# Patient Record
Sex: Female | Born: 1995 | Race: White | Hispanic: No | Marital: Single | Smoking: Never smoker
Health system: Southern US, Community
[De-identification: ages and names within clinical notes are randomized; demographics above are authoritative.]

## PROBLEM LIST (undated history)

## (undated) DIAGNOSIS — Z87442 Personal history of urinary calculi: Secondary | ICD-10-CM

## (undated) DIAGNOSIS — Q6589 Other specified congenital deformities of hip: Secondary | ICD-10-CM

---
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
## 2015-06-23 ENCOUNTER — Inpatient Hospital Stay: Attending: Rehabilitative and Restorative Service Providers" | Primary: Orthopaedic Surgery

## 2016-12-24 ENCOUNTER — Emergency Department
Admission: EM | Admit: 2016-12-24 | Discharge: 2016-12-24 | Disposition: A | Discharge status: Home / Self Care | Payer: BLUE CROSS/BLUE SHIELD | Attending: Emergency Medicine | Admitting: Emergency Medicine

## 2016-12-24 ENCOUNTER — Encounter: Payer: Self-pay | Admitting: Emergency Medicine

## 2016-12-24 ENCOUNTER — Emergency Department: Payer: BLUE CROSS/BLUE SHIELD

## 2016-12-24 DIAGNOSIS — R1031 Right lower quadrant pain: Secondary | ICD-10-CM | POA: Diagnosis present

## 2016-12-24 DIAGNOSIS — N23 Unspecified renal colic: Secondary | ICD-10-CM | POA: Diagnosis not present

## 2016-12-24 HISTORY — DX: Other specified congenital deformities of hip: Q65.89

## 2016-12-24 LAB — COMPREHENSIVE METABOLIC PANEL
ALK PHOS: 83 U/L (ref 38–126)
ALT: 17 U/L (ref 14–54)
ANION GAP: 9 (ref 5–15)
AST: 25 U/L (ref 15–41)
Albumin: 4.7 g/dL (ref 3.5–5.0)
BILIRUBIN TOTAL: 0.5 mg/dL (ref 0.3–1.2)
BUN: 20 mg/dL (ref 6–20)
CALCIUM: 9.4 mg/dL (ref 8.9–10.3)
CO2: 26 mmol/L (ref 22–32)
Chloride: 102 mmol/L (ref 101–111)
Creatinine, Ser: 0.75 mg/dL (ref 0.44–1.00)
Glucose, Bld: 76 mg/dL (ref 65–99)
Potassium: 3.5 mmol/L (ref 3.5–5.1)
Sodium: 137 mmol/L (ref 135–145)
TOTAL PROTEIN: 7.8 g/dL (ref 6.5–8.1)

## 2016-12-24 LAB — CBC
HCT: 41 % (ref 35.0–47.0)
HEMOGLOBIN: 14 g/dL (ref 12.0–16.0)
MCH: 29.9 pg (ref 26.0–34.0)
MCHC: 34.2 g/dL (ref 32.0–36.0)
MCV: 87.3 fL (ref 80.0–100.0)
Platelets: 309 10*3/uL (ref 150–440)
RBC: 4.7 MIL/uL (ref 3.80–5.20)
RDW: 12.7 % (ref 11.5–14.5)
WBC: 5.7 10*3/uL (ref 3.6–11.0)

## 2016-12-24 LAB — URINALYSIS, COMPLETE (UACMP) WITH MICROSCOPIC
BILIRUBIN URINE: NEGATIVE
Glucose, UA: NEGATIVE mg/dL
KETONES UR: NEGATIVE mg/dL
NITRITE: NEGATIVE
PH: 6 (ref 5.0–8.0)
Protein, ur: NEGATIVE mg/dL
Specific Gravity, Urine: 1.006 (ref 1.005–1.030)

## 2016-12-24 LAB — POCT PREGNANCY, URINE: Preg Test, Ur: NEGATIVE

## 2016-12-24 LAB — LIPASE, BLOOD: Lipase: 31 U/L (ref 11–51)

## 2016-12-24 MED ORDER — TAMSULOSIN HCL 0.4 MG PO CAPS: 0.4000 mg | ORAL_CAPSULE | Freq: Every day | ORAL | 0 refills | Status: AC

## 2016-12-24 MED ORDER — OXYCODONE-ACETAMINOPHEN 5-325 MG PO TABS: 2.0000 | ORAL_TABLET | Freq: Four times a day (QID) | ORAL | 0 refills | Status: DC | PRN

## 2016-12-24 MED ORDER — TAMSULOSIN HCL 0.4 MG PO CAPS
0.4000 mg | ORAL_CAPSULE | Freq: Once | ORAL | Status: AC
  Administered 2016-12-24: 0.4 mg via ORAL
  Filled 2016-12-24: qty 1

## 2016-12-24 MED ORDER — KETOROLAC TROMETHAMINE 10 MG PO TABS
10.0000 mg | ORAL_TABLET | Freq: Once | ORAL | Status: AC
  Administered 2016-12-24: 10 mg via ORAL
  Filled 2016-12-24: qty 1

## 2016-12-24 MED ORDER — ONDANSETRON 4 MG PO TBDP: 4.0000 mg | ORAL_TABLET | Freq: Three times a day (TID) | ORAL | 0 refills | Status: DC | PRN

## 2016-12-24 NOTE — ED Triage Notes (Signed)
Lower right abd pain began this am.

## 2016-12-24 NOTE — ED Notes (Signed)
Pt updated on ct renal status. Pt verbalizes understanding.

## 2016-12-24 NOTE — ED Provider Notes (Signed)
[facility] Emergency Department Provider Note        Time seen: ----------------------------------------- 9:54 PM on 12/24/2016 -----------------------------------------    I have reviewed the triage vital signs and the nursing notes.   HISTORY  Chief Complaint Abdominal Pain    HPI Alejandra Lam is a 21 y.o. female who presents to the ER for right lower quadrant abdominal pain that began this morning. She has not had a history of this before, nothing makes it better or worse. She describes a sharp pain. She's not had any dysuria. She was not concerned that she was pregnant. She denies fevers, chills, chest pain, shortness of breath, vomiting and diarrhea.   Past Medical History:  Diagnosis Date  . Hip dysplasia     There are no active problems to display for this patient.   Past Surgical History:  Procedure Laterality Date  . HIP SURGERY Left     Allergies Patient has no known allergies.  Social History Social History  Substance Use Topics  . Smoking status: Never Smoker  . Smokeless tobacco: Not on file  . Alcohol use Not on file    Review of Systems Constitutional: Negative for fever. Cardiovascular: Negative for chest pain. Respiratory: Negative for shortness of breath. Gastrointestinal: Positive for abdominal pain Genitourinary: Negative for dysuria. Musculoskeletal: Negative for back pain. Skin: Negative for rash. Neurological: Negative for headaches, focal weakness or numbness.  10-point ROS otherwise negative.  ____________________________________________   PHYSICAL EXAM:  VITAL SIGNS: ED Triage Vitals  Enc Vitals Group     BP 12/24/16 1826 113/69     Pulse Rate 12/24/16 1826 87     Resp 12/24/16 1826 20     Temp 12/24/16 1826 98.5 F (36.9 C)     Temp Source 12/24/16 1826 Oral     SpO2 12/24/16 1826 99 %     Weight 12/24/16 1827 150 lb (68 kg)     Height 12/24/16 1827 5\' 7"  (1.702 m)     Head Circumference  --      Peak Flow --      Pain Score 12/24/16 1827 6     Pain Loc --      Pain Edu? --      Excl. in GC? --     Constitutional: Alert and oriented. Well appearing and in no distress. Eyes: Conjunctivae are normal. PERRL. Normal extraocular movements. ENT   Head: Normocephalic and atraumatic.   Nose: No congestion/rhinnorhea.   Mouth/Throat: Mucous membranes are moist.   Neck: No stridor. Cardiovascular: Normal rate, regular rhythm. No murmurs, rubs, or gallops. Respiratory: Normal respiratory effort without tachypnea nor retractions. Breath sounds are clear and equal bilaterally. No wheezes/rales/rhonchi. Gastrointestinal: Mild right lower quadrant tenderness, rebound or guarding. Normal bowel sounds. Musculoskeletal: Nontender with normal range of motion in all extremities. No lower extremity tenderness nor edema. Neurologic:  Normal speech and language. No gross focal neurologic deficits are appreciated.  Skin:  Skin is warm, dry and intact. No rash noted. Psychiatric: Mood and affect are normal. Speech and behavior are normal.  ____________________________________________  ED COURSE:  Pertinent labs & imaging results that were available during my care of the patient were reviewed by me and considered in my medical decision making (see chart for details). Patient presents to the ER for right lower quadrant abdominal pain. We will assess with labs and imaging.   Procedures ____________________________________________   LABS (pertinent positives/negatives)  Labs Reviewed  URINALYSIS, COMPLETE (UACMP) WITH MICROSCOPIC - Abnormal;  Notable for the following:       Result Value   Color, Urine YELLOW (*)    APPearance CLEAR (*)    Hgb urine dipstick LARGE (*)    Leukocytes, UA SMALL (*)    Bacteria, UA RARE (*)    Squamous Epithelial / LPF 0-5 (*)    All other components within normal limits  LIPASE, BLOOD  COMPREHENSIVE METABOLIC PANEL  CBC  POC URINE PREG, ED   POCT PREGNANCY, URINE    RADIOLOGY Images were viewed by me  CT renal protocol IMPRESSION: 1. 5 mm distal right ureteral calculus with mild hydroureteronephrosis. 2. Multiple bilateral renal calculi measuring up to 1 cm on the right.  ____________________________________________  FINAL ASSESSMENT AND PLAN  Renal colic  Plan: Patient with labs and imaging as dictated above. Patient is largely asymptomatic despite large kidney stone. She'll be discharged with Flomax, pain medicine and antiemetics. She'll be referred to urology for outpatient follow-up.   Emily Filbert, MD   Note: This note was generated in part or whole with voice recognition software. Voice recognition is usually quite accurate but there are transcription errors that can and very often do occur. I apologize for any typographical errors that were not detected and corrected.     Emily Filbert, MD 12/24/16 2156

## 2016-12-29 ENCOUNTER — Encounter: Payer: Self-pay | Admitting: Emergency Medicine

## 2016-12-29 ENCOUNTER — Emergency Department: Payer: BLUE CROSS/BLUE SHIELD

## 2016-12-29 ENCOUNTER — Emergency Department
Admission: EM | Admit: 2016-12-29 | Discharge: 2016-12-29 | Disposition: A | Discharge status: Home / Self Care | Payer: BLUE CROSS/BLUE SHIELD | Attending: Emergency Medicine | Admitting: Emergency Medicine

## 2016-12-29 DIAGNOSIS — N390 Urinary tract infection, site not specified: Secondary | ICD-10-CM

## 2016-12-29 DIAGNOSIS — N200 Calculus of kidney: Secondary | ICD-10-CM

## 2016-12-29 DIAGNOSIS — R109 Unspecified abdominal pain: Secondary | ICD-10-CM | POA: Diagnosis present

## 2016-12-29 LAB — CBC
HCT: 40.8 % (ref 35.0–47.0)
HEMOGLOBIN: 14 g/dL (ref 12.0–16.0)
MCH: 30 pg (ref 26.0–34.0)
MCHC: 34.3 g/dL (ref 32.0–36.0)
MCV: 87.4 fL (ref 80.0–100.0)
PLATELETS: 270 10*3/uL (ref 150–440)
RBC: 4.66 MIL/uL (ref 3.80–5.20)
RDW: 12.8 % (ref 11.5–14.5)
WBC: 4.5 10*3/uL (ref 3.6–11.0)

## 2016-12-29 LAB — BASIC METABOLIC PANEL
Anion gap: 8 (ref 5–15)
BUN: 17 mg/dL (ref 6–20)
CALCIUM: 9 mg/dL (ref 8.9–10.3)
CO2: 26 mmol/L (ref 22–32)
CREATININE: 0.65 mg/dL (ref 0.44–1.00)
Chloride: 102 mmol/L (ref 101–111)
GFR calc Af Amer: >60 mL/min (ref >60)
GLUCOSE: 97 mg/dL (ref 65–99)
Potassium: 3.9 mmol/L (ref 3.5–5.1)
Sodium: 136 mmol/L (ref 135–145)

## 2016-12-29 LAB — URINALYSIS, COMPLETE (UACMP) WITH MICROSCOPIC
BILIRUBIN URINE: NEGATIVE
GLUCOSE, UA: NEGATIVE mg/dL
Hgb urine dipstick: NEGATIVE
KETONES UR: NEGATIVE mg/dL
Nitrite: NEGATIVE
PROTEIN: NEGATIVE mg/dL
Specific Gravity, Urine: 1.015 (ref 1.005–1.030)
pH: 7 (ref 5.0–8.0)

## 2016-12-29 LAB — POCT PREGNANCY, URINE: PREG TEST UR: NEGATIVE

## 2016-12-29 MED ORDER — SODIUM CHLORIDE 0.9 % IV BOLUS (SEPSIS)
1000.0000 mL | Freq: Once | INTRAVENOUS | Status: AC
  Administered 2016-12-29: 1000 mL via INTRAVENOUS

## 2016-12-29 MED ORDER — ONDANSETRON HCL 4 MG/2ML IJ SOLN
4.0000 mg | Freq: Once | INTRAMUSCULAR | Status: AC
  Administered 2016-12-29: 4 mg via INTRAVENOUS
  Filled 2016-12-29: qty 2

## 2016-12-29 MED ORDER — SULFAMETHOXAZOLE-TRIMETHOPRIM 800-160 MG PO TABS: 1.0000 | ORAL_TABLET | Freq: Two times a day (BID) | ORAL | 0 refills | Status: DC

## 2016-12-29 MED ORDER — SULFAMETHOXAZOLE-TRIMETHOPRIM 800-160 MG PO TABS
1.0000 | ORAL_TABLET | Freq: Once | ORAL | Status: AC
  Administered 2016-12-29: 1 via ORAL
  Filled 2016-12-29: qty 1

## 2016-12-29 NOTE — ED Notes (Signed)
AOx3.  Skin warm and dry.  NAD.  Ambulates with easy and steady gait. 

## 2016-12-29 NOTE — ED Provider Notes (Signed)
[facility] Emergency Department Provider Note  ____________________________________________   First MD Initiated Contact with Patient 12/29/16 1424     (approximate)  I have reviewed the triage vital signs and the nursing notes.   HISTORY  Chief Complaint Flank Pain   HPI Alejandra Lam is a 21 y.o. female who was recently diagnosed with a right-sided kidney stone, 5 mm, on January 20 here in the emergency department. She says that she has not needed any Percocet at home for pain control does not use any Zofran. However, said that this morning she began feeling sharp pain at a maximum of 7 out of 10. She says the pain is a 1 out of 10 right now and she continues to deny any nausea and vomiting. She says the pain is to her right back and radiates around to the right hemipelvis. Denies any fevers.    Past Medical History:  Diagnosis Date  . Hip dysplasia     There are no active problems to display for this patient.   Past Surgical History:  Procedure Laterality Date  . HIP SURGERY Left     Prior to Admission medications   Medication Sig Start Date End Date Taking? Authorizing Provider  ondansetron (ZOFRAN ODT) 4 MG disintegrating tablet Take 1 tablet (4 mg total) by mouth every 8 (eight) hours as needed for nausea or vomiting. 12/24/16   Emily FilbertJonathan E Williams, MD  oxyCODONE-acetaminophen (PERCOCET) 5-325 MG tablet Take 2 tablets by mouth every 6 (six) hours as needed for moderate pain or severe pain. 12/24/16   Emily FilbertJonathan E Williams, MD  tamsulosin (FLOMAX) 0.4 MG CAPS capsule Take 1 capsule (0.4 mg total) by mouth daily after breakfast. 12/24/16   Emily FilbertJonathan E Williams, MD    Allergies Patient has no known allergies.  History reviewed. No pertinent family history.  Social History Social History  Substance Use Topics  . Smoking status: Never Smoker  . Smokeless tobacco: Never Used  . Alcohol use Yes     Comment: 4 times a month    Review of  Systems Constitutional: No fever/chills Eyes: No visual changes. ENT: No sore throat. Cardiovascular: Denies chest pain. Respiratory: Denies shortness of breath. Gastrointestinal:  No nausea, no vomiting.  No diarrhea.  No constipation. Genitourinary: Negative for dysuria. Musculoskeletal: as above Skin: Negative for rash. Neurological: Negative for headaches, focal weakness or numbness.  10-point ROS otherwise negative.  ____________________________________________   PHYSICAL EXAM:  VITAL SIGNS: ED Triage Vitals  Enc Vitals Group     BP 12/29/16 1300 131/81     Pulse Rate 12/29/16 1300 (!) 121     Resp 12/29/16 1300 16     Temp 12/29/16 1300 98.5 F (36.9 C)     Temp Source 12/29/16 1300 Oral     SpO2 12/29/16 1300 100 %     Weight 12/29/16 1302 150 lb (68 kg)     Height 12/29/16 1302 5\' 7"  (1.702 m)     Head Circumference --      Peak Flow --      Pain Score 12/29/16 1302 3     Pain Loc --      Pain Edu? --      Excl. in GC? --     Constitutional: Alert and oriented. Well appearing and in no acute distress. Eyes: Conjunctivae are normal. PERRL. EOMI. Head: Atraumatic. Nose: No congestion/rhinnorhea. Mouth/Throat: Mucous membranes are moist.   Neck: No stridor.   Cardiovascular: Normal rate, regular rhythm.  Grossly normal heart sounds.  Good peripheral circulation.Heart rate in the room was 86 bpm. Respiratory: Normal respiratory effort.  No retractions. Lungs CTAB. Gastrointestinal: Soft and nontender. No distention. Mild right CVA tenderness to palpation. Musculoskeletal: No lower extremity tenderness nor edema.  No joint effusions. Neurologic:  Normal speech and language. No gross focal neurologic deficits are appreciated.  Skin:  Skin is warm, dry and intact. No rash noted. Psychiatric: Mood and affect are normal. Speech and behavior are normal.  ____________________________________________   LABS (all labs ordered are listed, but only abnormal results are  displayed)  Labs Reviewed  URINALYSIS, COMPLETE (UACMP) WITH MICROSCOPIC - Abnormal; Notable for the following:       Result Value   Color, Urine YELLOW (*)    APPearance HAZY (*)    Leukocytes, UA MODERATE (*)    Bacteria, UA RARE (*)    Squamous Epithelial / LPF 0-5 (*)    All other components within normal limits  URINE CULTURE  BASIC METABOLIC PANEL  CBC  POC URINE PREG, ED  POCT PREGNANCY, URINE   ____________________________________________  EKG   ____________________________________________  RADIOLOGY  US Renal (Final result)  Result time 12/29/16 17:18:29  Final result by Corky Sox, MD (12/29/16 17:18:29)           Narrative:   CLINICAL DATA: Right-sided 5 mm stone.  EXAM: RENAL / URINARY TRACT ULTRASOUND COMPLETE  COMPARISON: None.  FINDINGS: Right Kidney:  Length: 11.2 cm. Multiple renal stones, corresponding to the findings on recent CT. Largest measured stone on today's ultrasound is 10 mm but this is likely an over-measurement based on the measurements on recent CT. Mild right-sided hydronephrosis, similar to recent CT.  Left Kidney:  Length: 10.2 cm. Multiple stones. No hydronephrosis.  Bladder:  Appears normal for degree of bladder distention.  IMPRESSION: 1. Bilateral nephrolithiasis. 2. Mild right-sided hydronephrosis.   Electronically Signed By: Bary Richard M.D. On: 12/29/2016 17:18          ____________________________________________   PROCEDURES  Procedure(s) performed:   Procedures  Critical Care performed:   ____________________________________________   INITIAL IMPRESSION / ASSESSMENT AND PLAN / ED COURSE  Pertinent labs & imaging results that were available during my care of the patient were reviewed by me and considered in my medical decision making (see chart for details).  ----------------------------------------- 4:18 PM on 12/29/2016 -----------------------------------------  I  discussed case with Dr. Claiborne Billings radiology recommends to start patient on Septra and sent a urine culture. He says that he will see the patient in a.m. tomorrow as long as there is no sign of obstruction on the ultrasound of the kidneys.    ----------------------------------------- 5:58 PM on 12/29/2016 -----------------------------------------  Patient resting comfortable at this time. Reading a book. Says her pain is a 2 out of 10 and is "normal." She had mild hydronephrosis on her ultrasound. Again without any fever. Normal white blood cell count. I discussed the plan for follow-up tomorrow morning with Dr. Artis Flock and she is understanding on to comply. We discussed return to the emergency department for any worsening or concerning symptoms such as nausea or vomiting, pain and especially fever. It is likely that the stone is moving and that this is what is causing her discomfort at this time. She will also be discharged on Septra. She is understanding of this plan and willing to comply. The patient is nontoxic appearing at this time.   ____________________________________________   FINAL CLINICAL IMPRESSION(S) / ED DIAGNOSES  UTI. Kidney stone.  NEW MEDICATIONS STARTED DURING THIS VISIT:  New Prescriptions   No medications on file     Note:  This document was prepared using Dragon voice recognition software and may include unintentional dictation errors.    Myrna Blazer, MD 12/29/16 [phone]

## 2016-12-29 NOTE — ED Triage Notes (Signed)
Pt to ED c/o right flank pain since this morning.  States recently seen and treated for right sided kidney stone this past Saturday.  Pt denies n/v/d.  Denies urinary symptoms.

## 2016-12-30 NOTE — H&P (Signed)
NAME:  Alejandra Lam, Alejandra Lam                     ACCOUNT NO.:  MEDICAL RECORD NO.:  [id number]  LOCATION:                                 FACILITY:  PHYSICIAN:  Alejandra Lam          DATE OF BIRTH:  02/29/1996  DATE OF ADMISSION: DATE OF DISCHARGE:                            HISTORY AND PHYSICAL   SAME-DAY SURGERY:  February 6th.  CHIEF COMPLAINT:  Right flank pain.  HISTORY OF PRESENT ILLNESS:  [name] is a 21 year old Caucasian female with the sudden onset of right lower quadrant abdominal pain associated with nausea and vomiting on January 27th.  She had a CT scan performed at that time, which revealed a distal 5 mm right ureteral stone with mild hydronephrosis.  The pain became severe again February 2nd prompting another ER visit.  She had an ultrasound at that time confirming mild hydronephrosis.  She was also noted to have bilateral renal calculi.  She comes in now for right ureteroscopic ureterolithotomy with holmium laser lithotripsy.  PAST MEDICAL HISTORY:  No drug allergies.  CURRENT MEDICATIONS:  Tamsulosin, Bactrim DS, Nucynta, Zuplenz, oxycodone.  SURGICAL HISTORY:  Repair of the left hip in 2015.  SOCIAL HISTORY:  The patient denied tobacco use.  She consumes 2 alcoholic beverages per week.  FAMILY HISTORY:  Father is living age 21 with history of kidney stones. Mother is living age 21 with history of breast cancer and colon cancer.  PAST AND CURRENT MEDICAL CONDITION:  Negative.  REVIEW OF SYSTEMS:  The patient denies chest pain, shortness of breath, diabetes, stroke, or hypertension.  PHYSICAL EXAMINATION:  GENERAL:  A well-nourished white female, in no distress. HEENT:  Sclerae clear.  Pupils are equally round and reactive to light accommodation.  Extraocular motions are intact. NECK:  Supple.  No palpable cervical adenopathy. LUNGS:  Clear to auscultation. CARDIOVASCULAR:  Regular rhythm and rate without audible murmurs. ABDOMEN:  Soft, nontender  abdomen. GU AND RECTAL:  Deferred. NEUROMUSCULAR:  Alert and oriented x3.  IMPRESSION: 1. Right ureterolithiasis with renal colic and hydronephrosis. 2. Bilateral nephrolithiasis.  PLAN:  Right ureteroscopic ureterolithotomy with holmium laser lithotripsy.          ______________________________ Alejandra Lam     MW/MEDQ  D:  12/30/2016  T:  12/30/2016  Job:  [id number]

## 2016-12-31 LAB — URINE CULTURE

## 2017-01-02 ENCOUNTER — Encounter
Admission: RE | Admit: 2017-01-02 | Discharge: 2017-01-02 | Disposition: A | Discharge status: Home / Self Care | Payer: BLUE CROSS/BLUE SHIELD | Source: Ambulatory Visit | Attending: Urology | Admitting: Urology

## 2017-01-02 HISTORY — DX: Personal history of urinary calculi: Z87.442

## 2017-01-02 MED ORDER — DEXTROSE 5 % IV SOLN
1.0000 g | Freq: Once | INTRAVENOUS | Status: AC
  Administered 2017-01-03: 1 g via INTRAVENOUS
  Filled 2017-01-02: qty 10

## 2017-01-02 NOTE — Patient Instructions (Signed)
  Your procedure is scheduled on: 01-03-17 Report to Same Day Surgery 2nd floor medical mall [facility](Medical Mall Entrance-take elevator on left to 2nd floor.  Check in with surgery information desk.) To find out your arrival time please call [phone] between 1PM - 3PM on 01-02-17  Remember: Instructions that are not followed completely may result in serious medical risk, up to and including death, or upon the discretion of your surgeon and anesthesiologist your surgery may need to be rescheduled.    _x___ 1. Do not eat food or drink liquids after midnight. No gum chewing or hard candies.     __x__ 2. No Alcohol for 24 hours before or after surgery.   __x__3. No Smoking for 24 prior to surgery.   ____  4. Bring all medications with you on the day of surgery if instructed.    __x__ 5. Notify your doctor if there is any change in your medical condition     (cold, fever, infections).     Do not wear jewelry, make-up, hairpins, clips or nail polish.  Do not wear lotions, powders, or perfumes. You may wear deodorant.  Do not shave 48 hours prior to surgery. Men may shave face and neck.  Do not bring valuables to the hospital.    [facility] is not responsible for any belongings or valuables.               Contacts, dentures or bridgework may not be worn into surgery.  Leave your suitcase in the car. After surgery it may be brought to your room.  For patients admitted to the hospital, discharge time is determined by your treatment team.   Patients discharged the day of surgery will not be allowed to drive home.  You will need someone to drive you home and stay with you the night of your procedure.    Please read over the following fact sheets that you were given:   [facility] Preparing for Surgery and or MRSA Information   _x___ Take these medicines the morning of surgery with A SIP OF WATER:    1. MAY TAKE PERCOCET IF NEEDED AM OF SURGERY  2.  3.  4.  5.  6.  ____Fleets enema or  Magnesium Citrate as directed.   ____ Use CHG Soap or sage wipes as directed on instruction sheet   ____ Use inhalers on the day of surgery and bring to hospital day of surgery  ____ Stop metformin 2 days prior to surgery    ____ Take 1/2 of usual insulin dose the night before surgery and none on the morning of  surgery.   ____ Stop Aspirin, Coumadin, Pllavix ,Eliquis, Effient, or Pradaxa  x__ Stop Anti-inflammatories such as Advil, Aleve, Ibuprofen, Motrin, Naproxen,          Naprosyn, Goodies powders or aspirin products NOW-Ok to take Tylenol.   ____ Stop supplements until after surgery.    ____ Bring C-Pap to the hospital.

## 2017-01-03 ENCOUNTER — Encounter: Payer: Self-pay | Admitting: *Deleted

## 2017-01-03 ENCOUNTER — Ambulatory Visit
Admission: RE | Admit: 2017-01-03 | Discharge: 2017-01-03 | Disposition: A | Discharge status: Home / Self Care | Payer: BLUE CROSS/BLUE SHIELD | Source: Ambulatory Visit | Attending: Urology | Admitting: Urology

## 2017-01-03 ENCOUNTER — Ambulatory Visit: Payer: BLUE CROSS/BLUE SHIELD | Admitting: Anesthesiology

## 2017-01-03 ENCOUNTER — Encounter
Admission: RE | Disposition: A | Discharge status: Home / Self Care | Payer: Self-pay | Source: Ambulatory Visit | Attending: Urology

## 2017-01-03 DIAGNOSIS — N132 Hydronephrosis with renal and ureteral calculous obstruction: Secondary | ICD-10-CM | POA: Diagnosis not present

## 2017-01-03 DIAGNOSIS — N201 Calculus of ureter: Secondary | ICD-10-CM

## 2017-01-03 HISTORY — PX: URETEROSCOPY WITH HOLMIUM LASER LITHOTRIPSY: SHX6645

## 2017-01-03 LAB — POCT PREGNANCY, URINE: PREG TEST UR: NEGATIVE

## 2017-01-03 SURGERY — URETEROSCOPY, WITH LITHOTRIPSY USING HOLMIUM LASER
Anesthesia: General | Site: Ureter | Laterality: Right | Wound class: Clean Contaminated

## 2017-01-03 MED ORDER — DEXAMETHASONE SODIUM PHOSPHATE 10 MG/ML IJ SOLN
INTRAMUSCULAR | Status: AC
  Filled 2017-01-03: qty 1

## 2017-01-03 MED ORDER — FUROSEMIDE 10 MG/ML IJ SOLN
INTRAMUSCULAR | Status: AC
  Filled 2017-01-03: qty 4

## 2017-01-03 MED ORDER — LACTATED RINGERS IV SOLN: INTRAVENOUS | Status: DC | PRN

## 2017-01-03 MED ORDER — SODIUM CHLORIDE 0.9 % IV SOLN
INTRAVENOUS | Status: DC
  Administered 2017-01-03 (×2): via INTRAVENOUS

## 2017-01-03 MED ORDER — DEXAMETHASONE SODIUM PHOSPHATE 10 MG/ML IJ SOLN
INTRAMUSCULAR | Status: DC | PRN
  Administered 2017-01-03: 10 mg via INTRAVENOUS

## 2017-01-03 MED ORDER — ONDANSETRON HCL 4 MG/2ML IJ SOLN
INTRAMUSCULAR | Status: AC
  Filled 2017-01-03: qty 2

## 2017-01-03 MED ORDER — FAMOTIDINE 20 MG PO TABS
ORAL_TABLET | ORAL | Status: AC
  Filled 2017-01-03: qty 1

## 2017-01-03 MED ORDER — KETOROLAC TROMETHAMINE 30 MG/ML IJ SOLN
INTRAMUSCULAR | Status: DC | PRN
  Administered 2017-01-03: 30 mg via INTRAVENOUS

## 2017-01-03 MED ORDER — BELLADONNA ALKALOIDS-OPIUM 16.2-60 MG RE SUPP
RECTAL | Status: DC | PRN
  Administered 2017-01-03: 1 via RECTAL

## 2017-01-03 MED ORDER — FENTANYL CITRATE (PF) 100 MCG/2ML IJ SOLN
INTRAMUSCULAR | Status: DC | PRN
  Administered 2017-01-03: 50 ug via INTRAVENOUS

## 2017-01-03 MED ORDER — FENTANYL CITRATE (PF) 100 MCG/2ML IJ SOLN: 25.0000 ug | INTRAMUSCULAR | Status: DC | PRN

## 2017-01-03 MED ORDER — IOTHALAMATE MEGLUMINE 43 % IV SOLN
INTRAVENOUS | Status: DC | PRN
  Administered 2017-01-03: 15 mL

## 2017-01-03 MED ORDER — KETOROLAC TROMETHAMINE 30 MG/ML IJ SOLN
INTRAMUSCULAR | Status: AC
  Filled 2017-01-03: qty 1

## 2017-01-03 MED ORDER — FENTANYL CITRATE (PF) 100 MCG/2ML IJ SOLN
INTRAMUSCULAR | Status: AC
  Filled 2017-01-03: qty 2

## 2017-01-03 MED ORDER — LIDOCAINE HCL 2 % EX GEL
CUTANEOUS | Status: DC | PRN
  Administered 2017-01-03: 1

## 2017-01-03 MED ORDER — LIDOCAINE HCL (PF) 2 % IJ SOLN
INTRAMUSCULAR | Status: AC
  Filled 2017-01-03: qty 2

## 2017-01-03 MED ORDER — MIDAZOLAM HCL 2 MG/2ML IJ SOLN
INTRAMUSCULAR | Status: AC
  Filled 2017-01-03: qty 2

## 2017-01-03 MED ORDER — BELLADONNA ALKALOIDS-OPIUM 16.2-60 MG RE SUPP
RECTAL | Status: AC
  Filled 2017-01-03: qty 1

## 2017-01-03 MED ORDER — FUROSEMIDE 10 MG/ML IJ SOLN
INTRAMUSCULAR | Status: DC | PRN
  Administered 2017-01-03: 10 mg via INTRAMUSCULAR

## 2017-01-03 MED ORDER — ONDANSETRON HCL 4 MG/2ML IJ SOLN: 4.0000 mg | Freq: Once | INTRAMUSCULAR | Status: DC | PRN

## 2017-01-03 MED ORDER — FAMOTIDINE 20 MG PO TABS
20.0000 mg | ORAL_TABLET | Freq: Once | ORAL | Status: AC
  Administered 2017-01-03: 20 mg via ORAL

## 2017-01-03 MED ORDER — LIDOCAINE HCL 2 % EX GEL
CUTANEOUS | Status: AC
  Filled 2017-01-03: qty 10

## 2017-01-03 MED ORDER — MIDAZOLAM HCL 2 MG/2ML IJ SOLN
INTRAMUSCULAR | Status: DC | PRN
  Administered 2017-01-03: 2 mg via INTRAVENOUS

## 2017-01-03 MED ORDER — PROPOFOL 10 MG/ML IV BOLUS
INTRAVENOUS | Status: DC | PRN
  Administered 2017-01-03: 150 mg via INTRAVENOUS

## 2017-01-03 MED ORDER — PROPOFOL 10 MG/ML IV BOLUS
INTRAVENOUS | Status: AC
  Filled 2017-01-03: qty 20

## 2017-01-03 MED ORDER — LIDOCAINE HCL (CARDIAC) 20 MG/ML IV SOLN
INTRAVENOUS | Status: DC | PRN
Start: 2017-01-03 — End: 2017-01-03
  Administered 2017-01-03: 75 mg via INTRAVENOUS

## 2017-01-03 SURGICAL SUPPLY — 23 items
BAG DRAIN CYSTO-URO LG1000N (MISCELLANEOUS) ×2 IMPLANT
CATH URETL 5X70 OPEN END (CATHETERS) ×2 IMPLANT
CNTNR SPEC 2.5X3XGRAD LEK (MISCELLANEOUS) ×1
CONRAY 43 FOR UROLOGY 50M (MISCELLANEOUS) ×2 IMPLANT
CONT SPEC 4OZ STER OR WHT (MISCELLANEOUS) ×1
CONTAINER SPEC 2.5X3XGRAD LEK (MISCELLANEOUS) ×1 IMPLANT
FIBER LASER 365 (Laser) ×2 IMPLANT
GLOVE BIO SURGEON STRL SZ7 (GLOVE) ×4 IMPLANT
GLOVE BIO SURGEON STRL SZ7.5 (GLOVE) ×2 IMPLANT
GOWN STRL REUS W/ TWL LRG LVL4 (GOWN DISPOSABLE) ×1 IMPLANT
GOWN STRL REUS W/TWL LRG LVL4 (GOWN DISPOSABLE) ×1
GOWN STRL REUS W/TWL XL LVL4 (GOWN DISPOSABLE) ×2 IMPLANT
GUIDEWIRE STR ZIPWIRE 035X150 (MISCELLANEOUS) ×2 IMPLANT
KIT RM TURNOVER CYSTO AR (KITS) ×2 IMPLANT
PACK CYSTO AR (MISCELLANEOUS) ×2 IMPLANT
PREP PVP WINGED SPONGE (MISCELLANEOUS) IMPLANT
SET CYSTO W/LG BORE CLAMP LF (SET/KITS/TRAYS/PACK) ×2 IMPLANT
SOL .9 NS 3000ML IRR  AL (IV SOLUTION) ×1
SOL .9 NS 3000ML IRR UROMATIC (IV SOLUTION) ×1 IMPLANT
SOL PREP PVP 2OZ (MISCELLANEOUS) ×2
SOLUTION PREP PVP 2OZ (MISCELLANEOUS) ×1 IMPLANT
SURGILUBE 2OZ TUBE FLIPTOP (MISCELLANEOUS) ×2 IMPLANT
WATER STERILE IRR 1000ML POUR (IV SOLUTION) ×2 IMPLANT

## 2017-01-03 NOTE — Anesthesia Postprocedure Evaluation (Signed)
Anesthesia Post Note  Patient: Alejandra Lam  Procedure(s) Performed: Procedure(s) (LRB): URETEROSCOPY WITH HOLMIUM LASER LITHOTRIPSY (Right)  Patient location during evaluation: PACU Anesthesia Type: General Level of consciousness: awake and alert Pain management: pain level controlled Vital Signs Assessment: post-procedure vital signs reviewed and stable Respiratory status: spontaneous breathing and respiratory function stable Cardiovascular status: stable Anesthetic complications: no     Last Vitals:  Vitals:   01/03/17 1350 01/03/17 1359  BP: (!) 108/48   Pulse: 70 (!) 54  Resp: 13 12  Temp:      Last Pain:  Vitals:   01/03/17 1339  TempSrc: Tympanic  PainSc: 0-No pain                 Lailynn Southgate K

## 2017-01-03 NOTE — Anesthesia Preprocedure Evaluation (Signed)
Anesthesia Evaluation  Patient identified by MRN, date of birth, ID band Patient awake    Reviewed: Allergy & Precautions, NPO status , Patient's Chart, lab work & pertinent test results  History of Anesthesia Complications Negative for: history of anesthetic complications  Airway Mallampati: I       Dental   Pulmonary neg pulmonary ROS,           Cardiovascular negative cardio ROS       Neuro/Psych negative neurological ROS     GI/Hepatic negative GI ROS, Neg liver ROS,   Endo/Other  negative endocrine ROS  Renal/GU negative Renal ROS     Musculoskeletal   Abdominal   Peds  Hematology negative hematology ROS (+)   Anesthesia Other Findings   Reproductive/Obstetrics                             Anesthesia Physical Anesthesia Plan  ASA: I  Anesthesia Plan: General   Post-op Pain Management:    Induction: Intravenous  Airway Management Planned: LMA  Additional Equipment:   Intra-op Plan:   Post-operative Plan:   Informed Consent: I have reviewed the patients History and Physical, chart, labs and discussed the procedure including the risks, benefits and alternatives for the proposed anesthesia with the patient or authorized representative who has indicated his/her understanding and acceptance.     Plan Discussed with:   Anesthesia Plan Comments:         Anesthesia Quick Evaluation

## 2017-01-03 NOTE — Transfer of Care (Signed)
Immediate Anesthesia Transfer of Care Note  Patient: Alejandra DegreeCelia R Tecson  Procedure(s) Performed: Procedure(s): URETEROSCOPY WITH HOLMIUM LASER LITHOTRIPSY (Right)  Patient Location: PACU  Anesthesia Type:General  Level of Consciousness: awake, alert , oriented and patient cooperative  Airway & Oxygen Therapy: Patient Spontanous Breathing  Post-op Assessment: Report given to RN and Post -op Vital signs reviewed and stable  Post vital signs: Reviewed and stable  Last Vitals:  Vitals:   01/03/17 1219 01/03/17 1339  BP: 114/62 115/73  Pulse: 90 79  Resp: 16 11  Temp: 37 C 36.3 C    Last Pain:  Vitals:   01/03/17 1339  TempSrc: Tympanic  PainSc: 0-No pain         Complications: No apparent anesthesia complications

## 2017-01-03 NOTE — H&P (Signed)
Date of Initial H&P: 12/30/16  History reviewed, patient examined, no change in status, stable for surgery.

## 2017-01-03 NOTE — Anesthesia Procedure Notes (Signed)
Procedure Name: LMA Insertion Date/Time: 01/03/2017 12:59 PM Performed by: Marlana SalvageJESSUP, Rayleen Wyrick Pre-anesthesia Checklist: Patient identified, Emergency Drugs available, Suction available, Patient being monitored and Timeout performed Patient Re-evaluated:Patient Re-evaluated prior to inductionOxygen Delivery Method: Circle system utilized Preoxygenation: Pre-oxygenation with 100% oxygen Intubation Type: IV induction Ventilation: Mask ventilation without difficulty LMA: LMA inserted LMA Size: 4.0 Number of attempts: 1 Placement Confirmation: positive ETCO2 and breath sounds checked- equal and bilateral Dental Injury: Teeth and Oropharynx as per pre-operative assessment

## 2017-01-03 NOTE — Anesthesia Post-op Follow-up Note (Cosign Needed)
Anesthesia QCDR form completed.        

## 2017-01-03 NOTE — Discharge Instructions (Addendum)
AMBULATORY SURGERY  DISCHARGE INSTRUCTIONS   1) The drugs that you were given will stay in your system until tomorrow so for the next 24 hours you should not:  A) Drive an automobile B) Make any legal decisions C) Drink any alcoholic beverage   2) You may resume regular meals tomorrow.  Today it is better to start with liquids and gradually work up to solid foods.  You may eat anything you prefer, but it is better to start with liquids, then soup and crackers, and gradually work up to solid foods.   3) Please notify your doctor immediately if you have any unusual bleeding, trouble breathing, redness and pain at the surgery site, drainage, fever, or pain not relieved by medication.    4) Additional Instructions:        Please contact your physician with any problems or Same Day Surgery at [phone], Monday through Friday 6 am to 4 pm, or [place] at [facility] number at [phone].     Renal Colic Renal colic is pain that is caused by passing a kidney stone. The pain can be sharp and severe. It may be felt in the back, abdomen, side (flank), or groin. It can cause nausea. Renal colic can come and go. Follow these instructions at home: Watch your condition for any changes. The following actions may help to lessen any discomfort that you are feeling:  Take medicines only as directed by your health care provider.  Ask your health care provider if it is okay to take over-the-counter pain medicine.  Drink enough fluid to keep your urine clear or pale yellow. Drink 6-8 glasses of water each day.  Limit the amount of salt that you eat to less than 2 grams per day.  Reduce the amount of protein in your diet. Eat less meat, fish, nuts, and dairy.  Avoid foods such as spinach, rhubarb, nuts, or bran. These may make kidney stones more likely to form. Contact a health care provider if:  You have a fever or chills.  Your urine smells bad or looks cloudy.  You have  pain or burning when you pass urine. Get help right away if:  Your flank pain or groin pain suddenly worsens.  You become confused or disoriented or you lose consciousness. This information is not intended to replace advice given to you by your health care provider. Make sure you discuss any questions you have with your health care provider. Document Released: 08/24/2005 Document Revised: 04/19/2016 Document Reviewed: 09/24/2014 Elsevier Interactive Patient Education  2017 Elsevier [employer]. Laser Therapy for Kidney Stones, Care After Introduction Refer to this sheet in the next few weeks. These instructions provide you with information on caring for yourself after your procedure. Your health care provider may also give you more specific instructions. Your treatment has been planned according to current medical practices, but problems sometimes occur. Call your health care provider if you have any problems or questions after your procedure. What can I expect after the procedure? After the procedure, it is common to have:  Pain.  A burning sensation while urinating.  Small amounts of blood in your urine.  A need to urinate frequently.  Pieces of kidney stone in your urine.  Mild discomfort when urinating that is felt in the tip of the penis in men or in the back. You may experience this if you have a flexible tube (stent) in your ureter. Follow these instructions at home:  Take over-the-counter and prescription medicines only  as told by your health care provider.  Take your antibiotic medicine as told by your health care provider. Do not stop taking the antibiotic even if you start to feel better.  Drink enough fluid to keep your urine clear or pale yellow. Your health care provider may recommend drinking two 8-ounce glasses of water per hour for a few hours after your procedure.  Return to your normal activities as told by your health care provider. Ask your health care provider what  activities are safe for you.  If your health care provider approves, you may take a warm bath to ease discomfort and burning.  Do notdrivefor 24 hours if you were given a medicine to help you relax (sedative).  Keep all follow-up visits as told by your health care provider. This is important. If you have a stent, you will need to return to your health care provider to have the stent removed. Contact a health care provider if:  You have pain or a burning feeling that lasts more than two days.  You feel nauseous.  You vomit more and more often.  You have difficulty urinating.  You have pain that gets worse or does not get better with medicine. Get help right away if:  You are unable to urinate, even if your bladder feels full.  You have bright red blood or blood clots in your urine.  You have more blood in your urine.  You have severe pain or discomfort.  You have a fever or shaking chills.  You have abdominal pain. This information is not intended to replace advice given to you by your health care provider. Make sure you discuss any questions you have with your health care provider. Document Released: 12/11/2015 Document Revised: 04/21/2016 Document Reviewed: 10/08/2015  2017 Elsevier Dietary Guidelines to Help Prevent Kidney Stones Your risk of kidney stones can be decreased by adjusting the foods you eat. The most important thing you can do is drink enough fluid. You should drink enough fluid to keep your urine clear or pale yellow. The following guidelines provide specific information for the type of kidney stone you have had. Guidelines according to type of kidney stone Calcium Oxalate Kidney Stones  Reduce the amount of salt you eat. Foods that have a lot of salt cause your body to release excess calcium into your urine. The excess calcium can combine with a substance called oxalate to form kidney stones.  Reduce the amount of animal protein you eat if the amount you  eat is excessive. Animal protein causes your body to release excess calcium into your urine. Ask your dietitian how much protein from animal sources you should be eating.  Avoid foods that are high in oxalates. If you take vitamins, they should have less than 500 mg of vitamin C. Your body turns vitamin C into oxalates. You do not need to avoid fruits and vegetables high in vitamin C. Calcium Phosphate Kidney Stones  Reduce the amount of salt you eat to help prevent the release of excess calcium into your urine.  Reduce the amount of animal protein you eat if the amount you eat is excessive. Animal protein causes your body to release excess calcium into your urine. Ask your dietitian how much protein from animal sources you should be eating.  Get enough calcium from food or take a calcium supplement (ask your dietitian for recommendations). Food sources of calcium that do not increase your risk of kidney stones include:  Broccoli.  Dairy products,  such as cheese and yogurt.  Pudding. Uric Acid Kidney Stones  Do not have more than 6 oz of animal protein per day. Food sources Electrical engineerAnimal Protein Sources  Meat (all types).  Poultry.  Eggs.  Fish, seafood. Foods High in MirantSalt  Salt seasonings.  Soy sauce.  Teriyaki sauce.  Cured and processed meats.  Salted crackers and snack foods.  Fast food.  Canned soups and most canned foods. Foods High in Oxalates  Grains:  Amaranth.  Barley.  Grits.  Wheat germ.  Bran.  Buckwheat flour.  All bran cereals.  Pretzels.  Whole wheat bread.  Vegetables:  Beans (wax).  Beets and beet greens.  Collard greens.  Eggplant.  [place]scarole.  Leeks.  Okra.  Parsley.  Rutabagas.  Spinach.  Swiss chard.  Tomato paste.  Fried potatoes.  Sweet potatoes.  Fruits:  Red currants.  Figs.  Kiwi.  Rhubarb.  Meat and Other Protein Sources:  Beans (dried).  Soy burgers and other soybean  products.  Miso.  Nuts (peanuts, almonds, pecans, cashews, hazelnuts).  Nut butters.  Sesame seeds and tahini (paste made of sesame seeds).  Poppy seeds.  Beverages:  Chocolate drink mixes.  Soy milk.  Instant iced tea.  Juices made from high-oxalate fruits or vegetables.  Other:  Carob.  Chocolate.  Fruitcake.  Marmalades. This information is not intended to replace advice given to you by your health care provider. Make sure you discuss any questions you have with your health care provider. Document Released: 03/11/2011 Document Revised: 04/21/2016 Document Reviewed: 10/11/2013 Elsevier Interactive Patient Education  2017 Elsevier Inc. Ureteroscopy, Care After This sheet gives you information about how to care for yourself after your procedure. Your health care provider may also give you more specific instructions. If you have problems or questions, contact your health care provider. What can I expect after the procedure? After the procedure, it is common to have:  A burning sensation when you urinate.  Blood in your urine.  Mild discomfort in the bladder area or kidney area when urinating.  Needing to urinate more often or urgently. Follow these instructions at home: Medicines  Take over-the-counter and prescription medicines only as told by your health care provider.  If you were prescribed an antibiotic medicine, take it as told by your health care provider. Do not stop taking the antibiotic even if you start to feel better. General instructions  Donot drive for 24 hours if you were given a medicine to help you relax (sedative) during your procedure.  To relieve burning, try taking a warm bath or holding a warm washcloth over your groin.  Drink enough fluid to keep your urine clear or pale yellow.  Drink two 8-ounce glasses of water every hour for the first 2 hours after you get home.  Continue to drink water often at home.  You can eat what you  usually do.  Keep all follow-up visits as told by your health care provider. This is important.  If you had a tube placed to keep urine flowing (ureteral stent), ask your health care provider when you need to return to have it removed. Contact a health care provider if:  You have chills or a fever.  You have burning pain for longer than 24 hours after the procedure.  You have blood in your urine for longer than 24 hours after the procedure. Get help right away if:  You have large amounts of blood in your urine.  You have blood clots in your  urine.  You have very bad pain.  You have chest pain or trouble breathing.  You are unable to urinate and you have the feeling of a full bladder. This information is not intended to replace advice given to you by your health care provider. Make sure you discuss any questions you have with your health care provider. Document Released: 11/19/2013 Document Revised: 08/30/2016 Document Reviewed: 08/26/2016 Elsevier Interactive Patient Education  2017 [employer].

## 2017-01-03 NOTE — OR Nursing (Signed)
Patient instructed how to collect and strain urine.

## 2017-01-03 NOTE — Op Note (Signed)
Preoperative diagnosis: Right ureterolithiasis  Postoperative diagnosis: Same   Procedure: 1. Right ureteroscopic ureterolithotomy with holmium laser lithotripsy                      2. Fluoroscopy    Surgeon: Suszanne ConnersMichael R. Evelene CroonWolff MD  Anesthesia: General  Indications:See the history and physical. After informed consent the above procedure(s) were requested     Technique and findings: After adequate general anesthesia been obtained the patient was placed into dorsal lithotomy position and the perineum was prepped and draped in the usual fashion. Fluoroscopy confirmed the presence of a 5 mm stone at the level of the right iliac vessels. The short rigid mini ureteroscope was then opened to the camera and visually advanced into the distal ureter. The scope was then advanced to the level of the stone. The 365  holmium laser fiber was passed through the scope and set at 10 W. The stone was then powdered. The ureteroscope was then removed. 10 cc of viscous Xylocaine was instilled within the urethra and the bladder. A B&O suppository was placed. The procedure was terminated and patient transferred to the recovery room in stable condition. Blood loss was minimal.

## 2017-01-04 ENCOUNTER — Ambulatory Visit: Payer: Self-pay | Admitting: Urology

## 2017-01-24 ENCOUNTER — Encounter: Payer: Self-pay | Admitting: *Deleted

## 2017-01-26 ENCOUNTER — Encounter: Payer: Self-pay | Admitting: *Deleted

## 2017-01-26 ENCOUNTER — Ambulatory Visit
Admission: RE | Admit: 2017-01-26 | Discharge: 2017-01-26 | Disposition: A | Discharge status: Home / Self Care | Payer: BLUE CROSS/BLUE SHIELD | Source: Ambulatory Visit | Attending: Urology | Admitting: Urology

## 2017-01-26 ENCOUNTER — Ambulatory Visit: Payer: BLUE CROSS/BLUE SHIELD

## 2017-01-26 ENCOUNTER — Encounter
Admission: RE | Disposition: A | Discharge status: Home / Self Care | Payer: Self-pay | Source: Ambulatory Visit | Attending: Urology

## 2017-01-26 DIAGNOSIS — Z79899 Other long term (current) drug therapy: Secondary | ICD-10-CM | POA: Insufficient documentation

## 2017-01-26 DIAGNOSIS — Z841 Family history of disorders of kidney and ureter: Secondary | ICD-10-CM | POA: Diagnosis not present

## 2017-01-26 DIAGNOSIS — N200 Calculus of kidney: Secondary | ICD-10-CM | POA: Diagnosis not present

## 2017-01-26 HISTORY — PX: EXTRACORPOREAL SHOCK WAVE LITHOTRIPSY: SHX1557

## 2017-01-26 LAB — POCT PREGNANCY, URINE: PREG TEST UR: NEGATIVE

## 2017-01-26 SURGERY — LITHOTRIPSY, ESWL
Anesthesia: Moderate Sedation | Laterality: Right

## 2017-01-26 MED ORDER — PROMETHAZINE HCL 25 MG/ML IJ SOLN
25.0000 mg | Freq: Once | INTRAMUSCULAR | Status: AC
  Administered 2017-01-26: 25 mg via INTRAMUSCULAR

## 2017-01-26 MED ORDER — PROMETHAZINE HCL 25 MG/ML IJ SOLN
INTRAMUSCULAR | Status: AC
  Filled 2017-01-26: qty 1

## 2017-01-26 MED ORDER — ONDANSETRON 8 MG PO TBDP: 8.0000 mg | ORAL_TABLET | Freq: Four times a day (QID) | ORAL | 3 refills | Status: DC | PRN

## 2017-01-26 MED ORDER — FUROSEMIDE 10 MG/ML IJ SOLN
INTRAMUSCULAR | Status: AC
  Administered 2017-01-26: 10 mg via INTRAVENOUS
  Filled 2017-01-26: qty 2

## 2017-01-26 MED ORDER — LEVOFLOXACIN 500 MG PO TABS
500.0000 mg | ORAL_TABLET | ORAL | Status: AC
  Administered 2017-01-26: 500 mg via ORAL

## 2017-01-26 MED ORDER — FUROSEMIDE 10 MG/ML IJ SOLN
10.0000 mg | Freq: Once | INTRAMUSCULAR | Status: AC
  Administered 2017-01-26: 10 mg via INTRAVENOUS

## 2017-01-26 MED ORDER — MIDAZOLAM HCL 2 MG/2ML IJ SOLN
INTRAMUSCULAR | Status: AC
  Filled 2017-01-26: qty 2

## 2017-01-26 MED ORDER — DEXTROSE-NACL 5-0.45 % IV SOLN
INTRAVENOUS | Status: DC
  Administered 2017-01-26: 13:00:00 via INTRAVENOUS

## 2017-01-26 MED ORDER — MORPHINE SULFATE (PF) 10 MG/ML IV SOLN
10.0000 mg | Freq: Once | INTRAVENOUS | Status: AC
  Administered 2017-01-26: 10 mg via INTRAMUSCULAR

## 2017-01-26 MED ORDER — DIPHENHYDRAMINE HCL 25 MG PO CAPS
ORAL_CAPSULE | ORAL | Status: AC
  Filled 2017-01-26: qty 1

## 2017-01-26 MED ORDER — SULFAMETHOXAZOLE-TRIMETHOPRIM 800-160 MG PO TABS: 1.0000 | ORAL_TABLET | Freq: Two times a day (BID) | ORAL | 1 refills | Status: AC

## 2017-01-26 MED ORDER — DIPHENHYDRAMINE HCL 25 MG PO CAPS
25.0000 mg | ORAL_CAPSULE | Freq: Once | ORAL | Status: AC
  Administered 2017-01-26: 25 mg via ORAL

## 2017-01-26 MED ORDER — LEVOFLOXACIN 500 MG PO TABS
ORAL_TABLET | ORAL | Status: AC
  Filled 2017-01-26: qty 1

## 2017-01-26 MED ORDER — MORPHINE SULFATE (PF) 10 MG/ML IV SOLN
INTRAVENOUS | Status: AC
  Filled 2017-01-26: qty 1

## 2017-01-26 MED ORDER — NUCYNTA 50 MG PO TABS: 50.0000 mg | ORAL_TABLET | Freq: Four times a day (QID) | ORAL | 0 refills | Status: AC | PRN

## 2017-01-26 MED ORDER — DIPHENHYDRAMINE HCL 25 MG PO CAPS
ORAL_CAPSULE | ORAL | Status: AC
  Administered 2017-01-26: 25 mg via ORAL
  Filled 2017-01-26: qty 1

## 2017-01-26 MED ORDER — DIPHENHYDRAMINE HCL 25 MG PO CAPS
25.0000 mg | ORAL_CAPSULE | ORAL | Status: AC
  Administered 2017-01-26: 25 mg via ORAL

## 2017-01-26 MED ORDER — MIDAZOLAM HCL 2 MG/2ML IJ SOLN
1.0000 mg | Freq: Once | INTRAMUSCULAR | Status: AC
  Administered 2017-01-26: 1 mg via INTRAMUSCULAR

## 2017-01-26 NOTE — Discharge Instructions (Addendum)
AMBULATORY SURGERY  DISCHARGE INSTRUCTIONS   1) The drugs that you were given will stay in your system until tomorrow so for the next 24 hours you should not:  A) Drive an automobile B) Make any legal decisions C) Drink any alcoholic beverage   2) You may resume regular meals tomorrow.  Today it is better to start with liquids and gradually work up to solid foods.  You may eat anything you prefer, but it is better to start with liquids, then soup and crackers, and gradually work up to solid foods.   3) Please notify your doctor immediately if you have any unusual bleeding, trouble breathing, redness and pain at the surgery site, drainage, fever, or pain not relieved by medication. 4)   5) Your post-operative visit with Dr.                                     is: Date:                        Time:    Please call to schedule your post-operative visit.  6) Additional Instructions:      Kidney Stones Kidney stones (urolithiasis) are solid, rock-like deposits that form inside of the organs that make urine (kidneys). A kidney stone may form in a kidney and move into the bladder, where it can cause intense pain and block the flow of urine. Kidney stones are created when high levels of certain minerals are found in the urine. They are usually passed through urination, but in some cases, medical treatment may be needed to remove them. What are the causes? Kidney stones may be caused by:  A condition in which certain glands produce too much parathyroid hormone (primary hyperparathyroidism), which causes too much calcium buildup in the blood.  Buildup of uric acid crystals in the bladder (hyperuricosuria). Uric acid is a chemical that the body produces when you eat certain foods. It usually exits the body in the urine.  Narrowing (stricture) of one or both of the tubes that drain urine from the kidneys to the bladder (ureters).  A kidney blockage that is present at birth (congenital  obstruction).  Past surgery on the kidney or the ureters, such as gastric bypass surgery. What increases the risk? The following factors make you more likely to develop kidney stones:  Having had a kidney stone in the past.  Having a family history of kidney stones.  Not drinking enough water.  Eating a diet that is high in protein, salt (sodium), or sugar.  Being overweight or obese. What are the signs or symptoms? Symptoms of a kidney stone may include:  Nausea.  Vomiting.  Blood in the urine (hematuria).  Pain in the side of the abdomen, right below the ribs (flank pain). Pain usually spreads (radiates) to the groin.  Needing to urinate frequently or urgently. How is this diagnosed? This condition may be diagnosed based on:  Your medical history.  A physical exam.  Blood tests.  Urine tests.  CT scan.  Abdominal X-ray.  A procedure to examine the inside of the bladder (cystoscopy). How is this treated? Treatment for kidney stones depends on the size, location, and makeup of the stones. Treatment may involve:  Analyzing your urine before and after you pass the stone through urination.  Being monitored at the hospital until you pass the stone through urination.  Increasing your fluid intake and decreasing the amount of calcium and protein in your diet.  A procedure to break up kidney stones in the bladder using:  A focused beam of light (laser therapy).  Shock waves (extracorporeal shock wave lithotripsy).  Surgery to remove kidney stones. This may be needed if you have severe pain or have stones that block your urinary tract. Follow these instructions at home: Eating and drinking    Drink enough fluid to keep your urine clear or pale yellow. This will help you to pass the kidney stone.  If directed, change your diet. This may include:  Limiting how much sodium you eat.  Eating more fruits and vegetables.  Limiting how much meat, poultry, fish,  and eggs you eat.  Follow instructions from your health care provider about eating or drinking restrictions. General instructions   Collect urine samples as told by your health care provider. You may need to collect a urine sample:  24 hours after you pass the stone.  8-12 weeks after passing the kidney stone, and every 6-12 months after that.  Strain your urine every time you urinate, for as long as directed. Use the strainer that your health care provider recommends.  Do not throw out the kidney stone after passing it. Keep the stone so it can be tested by your health care provider. Testing the makeup of your kidney stone may help prevent you from getting kidney stones in the future.  Take over-the-counter and prescription medicines only as told by your health care provider.  Keep all follow-up visits as told by your health care provider. This is important. You may need follow-up X-rays or ultrasounds to make sure that your stone has passed. How is this prevented? To prevent another kidney stone:  Drink enough fluid to keep your urine clear or pale yellow. This is the best way to prevent kidney stones.  Eat a healthy diet and follow recommendations from your health care provider about foods to avoid. You may be instructed to eat a low-protein diet. Recommendations vary depending on the type of kidney stone that you have.  Maintain a healthy weight. Contact a health care provider if:  You have pain that gets worse or does not get better with medicine. Get help right away if:  You have a fever or chills.  You develop severe pain.  You develop new abdominal pain.  You faint.  You are unable to urinate. This information is not intended to replace advice given to you by your health care provider. Make sure you discuss any questions you have with your health care provider. Document Released: 11/14/2005 Document Revised: 06/03/2016 Document Reviewed: 04/29/2016 Elsevier  Interactive Patient Education  2017 [employer]   Lithotripsy, Care After This sheet gives you information about how to care for yourself after your procedure. Your health care provider may also give you more specific instructions. If you have problems or questions, contact your health care provider. What can I expect after the procedure? After the procedure, it is common to have:  Some blood in your urine. This should only last for a few days.  Soreness in your back, sides, or upper abdomen for a few days.  Blotches or bruises on your back where the pressure wave entered the skin.  Pain, discomfort, or nausea when pieces (fragments) of the kidney stone move through the tube that carries urine from the kidney to the bladder (ureter). Stone fragments may pass soon after the procedure, but they  may continue to pass for up to 4-8 weeks.  If you have severe pain or nausea, contact your health care provider. This may be caused by a large stone that was not broken up, and this may mean that you need more treatment.  Some pain or discomfort during urination.  Some pain or discomfort in the lower abdomen or (in men) at the base of the penis. Follow these instructions at home: Medicines   Take over-the-counter and prescription medicines only as told by your health care provider.  If you were prescribed an antibiotic medicine, take it as told by your health care provider. Do not stop taking the antibiotic even if you start to feel better.  Do not drive for 24 hours if you were given a medicine to help you relax (sedative).  Do not drive or use heavy machinery while taking prescription pain medicine. Eating and drinking   Drink enough water and fluids to keep your urine clear or pale yellow. This helps any remaining pieces of the stone to pass. It can also help prevent new stones from forming.  Eat plenty of fresh fruits and vegetables.  Follow instructions from your health care provider  about eating and drinking restrictions. You may be instructed:  To reduce how much salt (sodium) you eat or drink. Check ingredients and nutrition facts on packaged foods and beverages.  To reduce how much meat you eat.  Eat the recommended amount of calcium for your age and gender. Ask your health care provider how much calcium you should have. General instructions   Get plenty of rest.  Most people can resume normal activities 1-2 days after the procedure. Ask your health care provider what activities are safe for you.  If directed, strain all urine through the strainer that was provided by your health care provider.  Keep all fragments for your health care provider to see. Any stones that are found may be sent to a medical lab for examination. The stone may be as small as a grain of salt.  Keep all follow-up visits as told by your health care provider. This is important. Contact a health care provider if:  You have pain that is severe or does not get better with medicine.  You have nausea that is severe or does not go away.  You have blood in your urine longer than your health care provider told you to expect.  You have more blood in your urine.  You have pain during urination that does not go away.  You urinate more frequently than usual and this does not go away.  You develop a rash or any other possible signs of an allergic reaction. Get help right away if:  You have severe pain in your back, sides, or upper abdomen.  You have severe pain while urinating.  Your urine is very dark red.  You have blood in your stool (feces).  You cannot pass any urine at all.  You feel a strong urge to urinate after emptying your bladder.  You have a fever or chills.  You develop shortness of breath, difficulty breathing, or chest pain.  You have severe nausea that leads to persistent vomiting.  You faint. Summary  After this procedure, it is common to have some pain,  discomfort, or nausea when pieces (fragments) of the kidney stone move through the tube that carries urine from the kidney to the bladder (ureter). If this pain or nausea is severe, however, you should contact your health  care provider.  Most people can resume normal activities 1-2 days after the procedure. Ask your health care provider what activities are safe for you.  Drink enough water and fluids to keep your urine clear or pale yellow. This helps any remaining pieces of the stone to pass, and it can help prevent new stones from forming.  If directed, strain your urine and keep all fragments for your health care provider to see. Fragments or stones may be as small as a grain of salt.  Get help right away if you have severe pain in your back, sides, or upper abdomen or have severe pain while urinating. This information is not intended to replace advice given to you by your health care provider. Make sure you discuss any questions you have with your health care provider. Document Released: 12/04/2007 Document Revised: 10/05/2016 Document Reviewed: 10/05/2016 Elsevier Interactive Patient Education  2017 [employer]   Lithotripsy Lithotripsy is a treatment that can sometimes help eliminate kidney stones and the pain that they cause. A form of lithotripsy, also known as extracorporeal shock wave lithotripsy, is a nonsurgical procedure that crushes a kidney stone with shock waves. These shock waves pass through your body and focus on the kidney stone. They cause the kidney stone to break up while it is still in the urinary tract. This makes it easier for the smaller pieces of stone to pass in the urine. Tell a health care provider about:  Any allergies you have.  All medicines you are taking, including vitamins, herbs, eye drops, creams, and over-the-counter medicines.  Any blood disorders you have.  Any surgeries you have had.  Any medical conditions you have.  Whether you are pregnant  or may be pregnant.  Any problems you or family members have had with anesthetic medicines. What are the risks? Generally, this is a safe procedure. However, problems may occur, including:  Infection.  Bleeding of the kidney.  Bruising of the kidney or skin.  Scarring of the kidney, which can lead to:  Increased blood pressure.  Poor kidney function.  Return (recurrence) of kidney stones.  Damage to other structures or organs, such as the liver, colon, spleen, or pancreas.  Blockage (obstruction) of the the tube that carries urine from the kidney to the bladder (ureter).  Failure of the kidney stone to break into pieces (fragments). What happens before the procedure? Staying hydrated  Follow instructions from your health care provider about hydration, which may include:  Up to 2 hours before the procedure - you may continue to drink clear liquids, such as water, clear fruit juice, black coffee, and plain tea. Eating and drinking restrictions  Follow instructions from your health care provider about eating and drinking, which may include:  8 hours before the procedure - stop eating heavy meals or foods such as meat, fried foods, or fatty foods.  6 hours before the procedure - stop eating light meals or foods, such as toast or cereal.  6 hours before the procedure - stop drinking milk or drinks that contain milk.  2 hours before the procedure - stop drinking clear liquids. General instructions   Plan to have someone take you home from the hospital or clinic.  Ask your health care provider about:  Changing or stopping your regular medicines. This is especially important if you are taking diabetes medicines or blood thinners.  Taking medicines such as aspirin and ibuprofen. These medicines and other NSAIDs can thin your blood. Do not take these medicines  for 7 days before your procedure if your health care provider instructs you not to.  You may have tests, such  as:  Blood tests.  Urine tests.  Imaging tests, such as a CT scan. What happens during the procedure?  To lower your risk of infection:  Your health care team will wash or sanitize their hands.  Your skin will be washed with soap.  An IV tube will be inserted into one of your veins. This tube will give you fluids and medicines.  You will be given one or more of the following:  A medicine to help you relax (sedative).  A medicine to make you fall asleep (general anesthetic).  A water-filled cushion may be placed behind your kidney or on your abdomen. In some cases you may be placed in a tub of lukewarm water.  Your body will be positioned in a way that makes it easy to target the kidney stone.  A flexible tube with holes in it (stent) may be placed in the ureter. This will help keep urine flowing from the kidney if the fragments of the stone have been blocking the ureter.  An X-ray or ultrasound exam will be done to locate your stone.  Shock waves will be aimed at the stone. If you are awake, you may feel a tapping sensation as the shock waves pass through your body. The procedure may vary among health care providers and hospitals. What happens after the procedure?  You may have an X-ray to see whether the procedure was able to break up the kidney stone and how much of the stone has passed. If large stone fragments remain after treatment, you may need to have a second procedure at a later time.  Your blood pressure, heart rate, breathing rate, and blood oxygen level will be monitored until the medicines you were given have worn off.  You may be given antibiotics or pain medicine as needed.  If a stent was placed in your ureter during surgery, it may stay in place for a few weeks.  You may need strain your urine to collect pieces of the kidney stone for testing.  You will need to drink plenty of water.  Do not drive for 24 hours if you were given a  sedative. Summary  Lithotripsy is a treatment that can sometimes help eliminate kidney stones and the pain that they cause.  A form of lithotripsy, also known as extracorporeal shock wave lithotripsy, is a nonsurgical procedure that crushes a kidney stone with shock waves.  Generally, this is a safe procedure. However, problems may occur, including damage to the kidney or other organs, infection, or obstruction of the tube that carries urine from the kidney to the bladder (ureter).  When you go home, you will need to drink plenty of water. You may be asked to strain your urine to collect pieces of the kidney stone for testing. This information is not intended to replace advice given to you by your health care provider. Make sure you discuss any questions you have with your health care provider. Document Released: 11/11/2000 Document Revised: 10/05/2016 Document Reviewed: 10/05/2016 Elsevier Interactive Patient Education  2017 [employer]   Renal Colic Renal colic is pain that is caused by passing a kidney stone. The pain can be sharp and severe. It may be felt in the back, abdomen, side (flank), or groin. It can cause nausea. Renal colic can come and go. Follow these instructions at home: Watch your condition for  any changes. The following actions may help to lessen any discomfort that you are feeling:  Take medicines only as directed by your health care provider.  Ask your health care provider if it is okay to take over-the-counter pain medicine.  Drink enough fluid to keep your urine clear or pale yellow. Drink 6-8 glasses of water each day.  Limit the amount of salt that you eat to less than 2 grams per day.  Reduce the amount of protein in your diet. Eat less meat, fish, nuts, and dairy.  Avoid foods such as spinach, rhubarb, nuts, or bran. These may make kidney stones more likely to form. Contact a health care provider if:  You have a fever or chills.  Your urine smells bad  or looks cloudy.  You have pain or burning when you pass urine. Get help right away if:  Your flank pain or groin pain suddenly worsens.  You become confused or disoriented or you lose consciousness. This information is not intended to replace advice given to you by your health care provider. Make sure you discuss any questions you have with your health care provider. Document Released: 08/24/2005 Document Revised: 04/19/2016 Document Reviewed: 09/24/2014 Elsevier Interactive Patient Education  2017 [employer]

## 2017-01-27 ENCOUNTER — Encounter: Payer: Self-pay | Admitting: Urology

## 2017-04-28 IMAGING — US US RENAL
1 series · 14 of 25 positions shown · non-contrast
Comparison: None.

CLINICAL DATA: Right-sided 5 mm stone.

EXAM:
RENAL / URINARY TRACT ULTRASOUND COMPLETE

[Series 1: us renal · 14 of 45 slices shown]
[im 1/45]
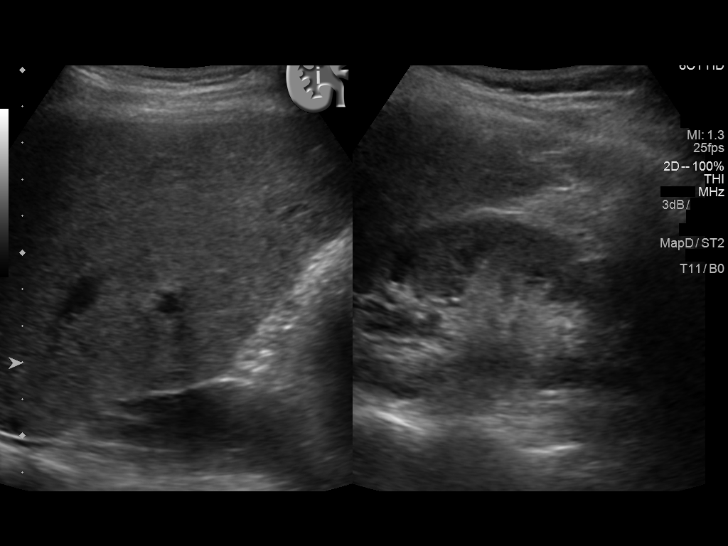
[im 4/45]
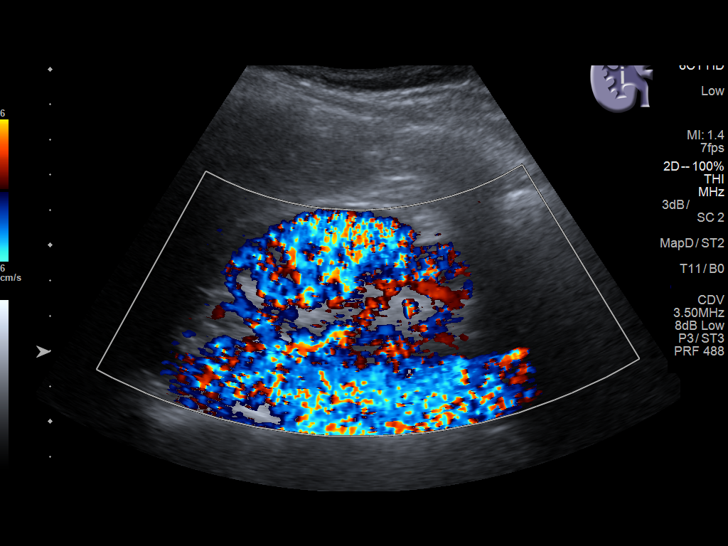
[im 8/45]
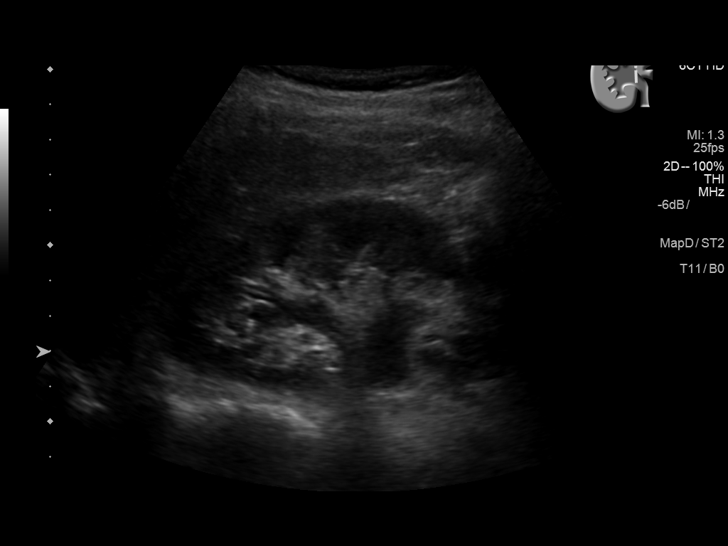
[im 12/45]
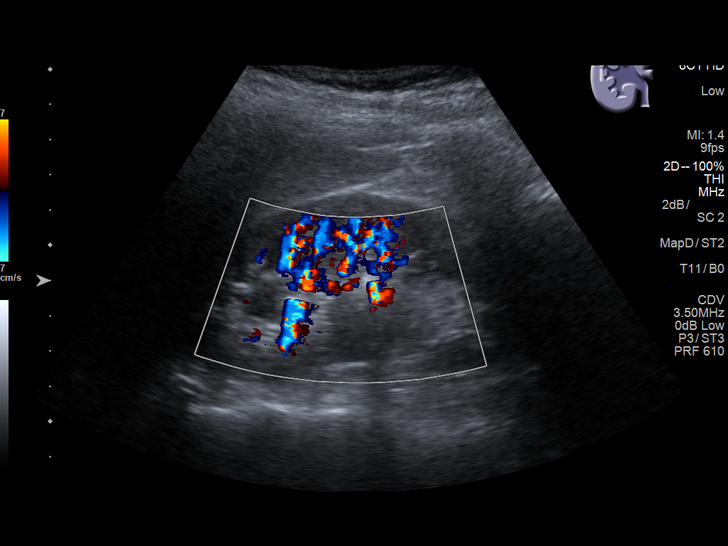
[im 15/45]
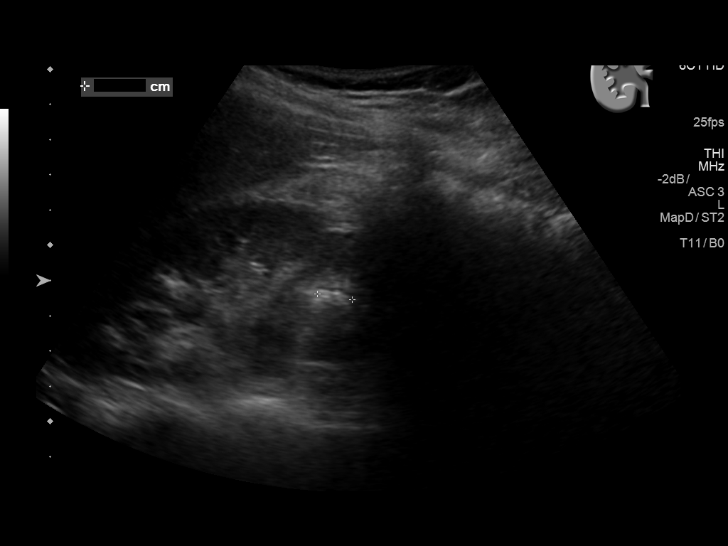
[im 17/45]
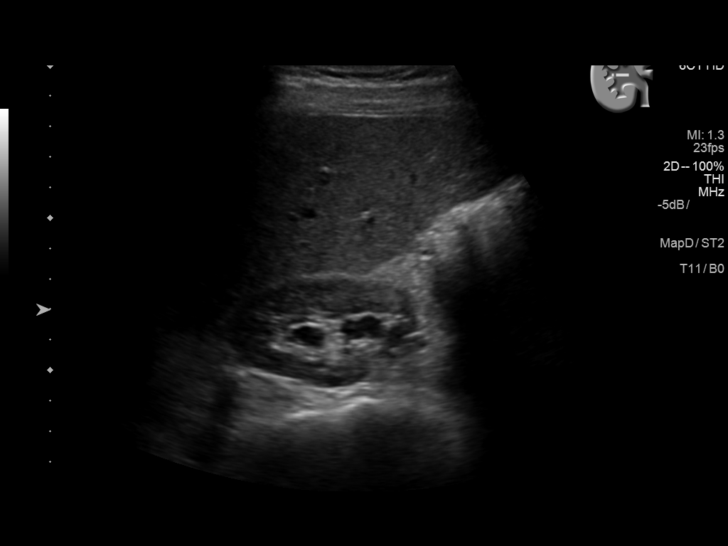
[im 21/45]
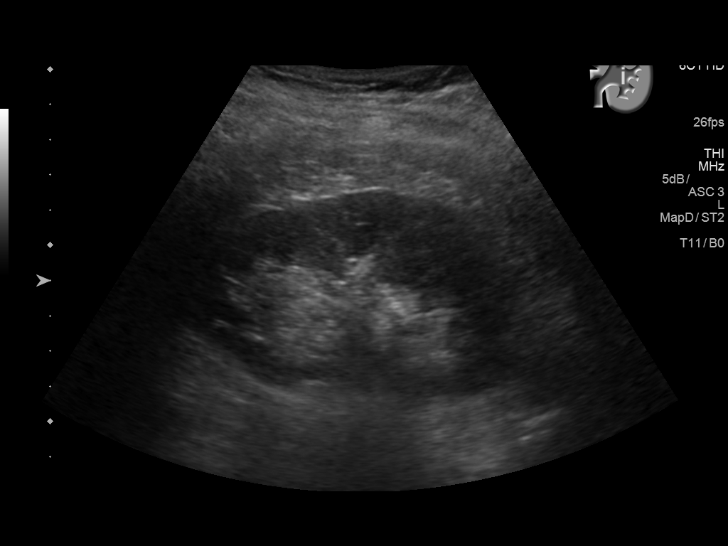
[im 24/45]
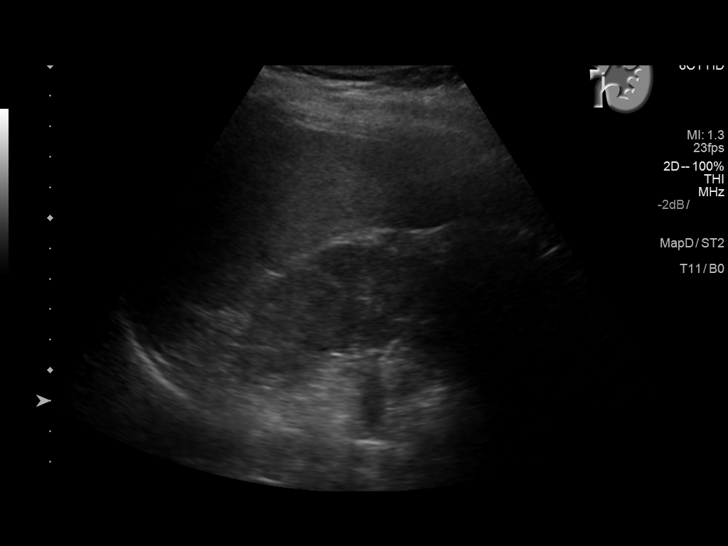
[im 28/45]
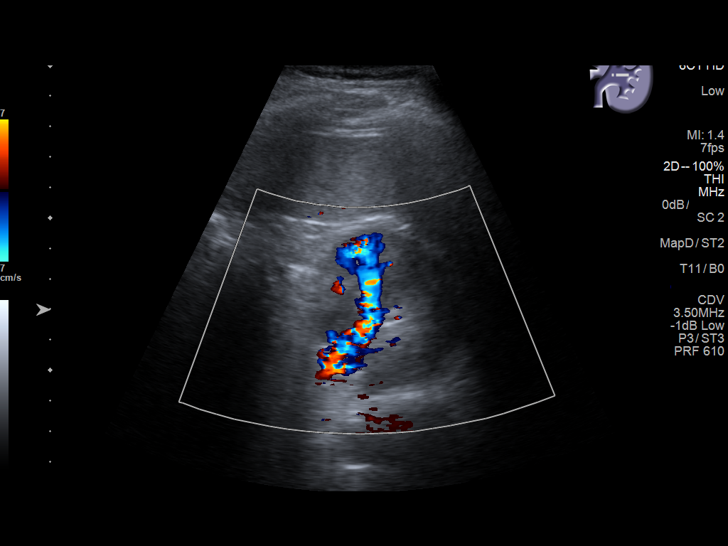
[im 30/45]
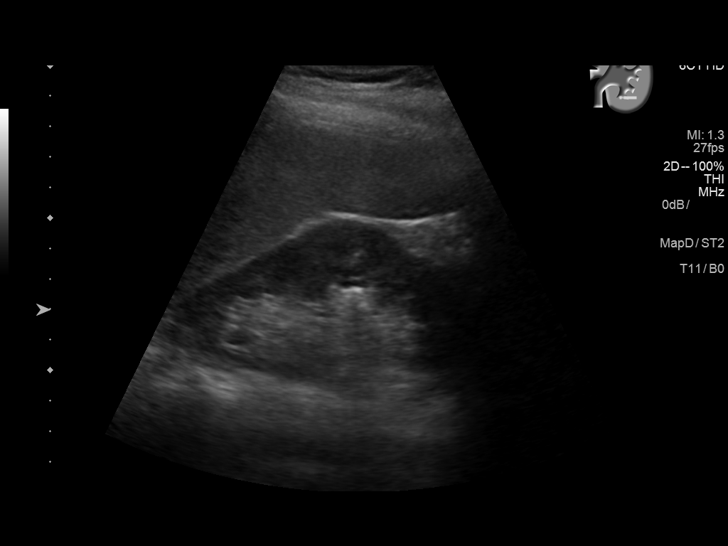
[im 34/45]
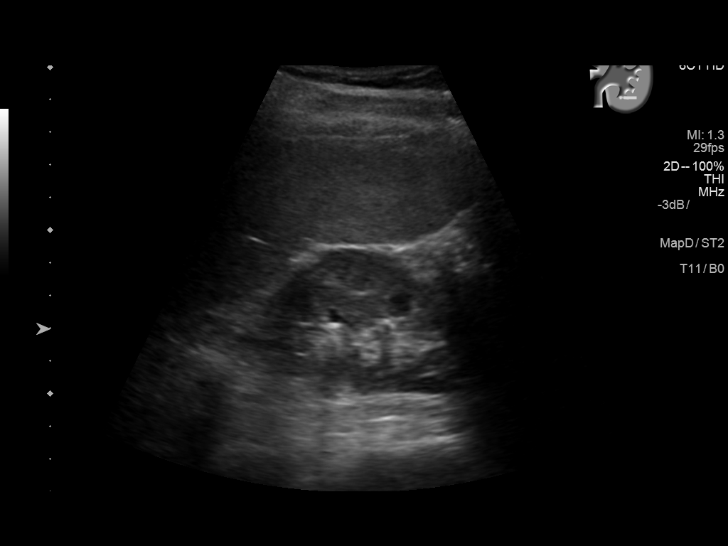
[im 37/45]
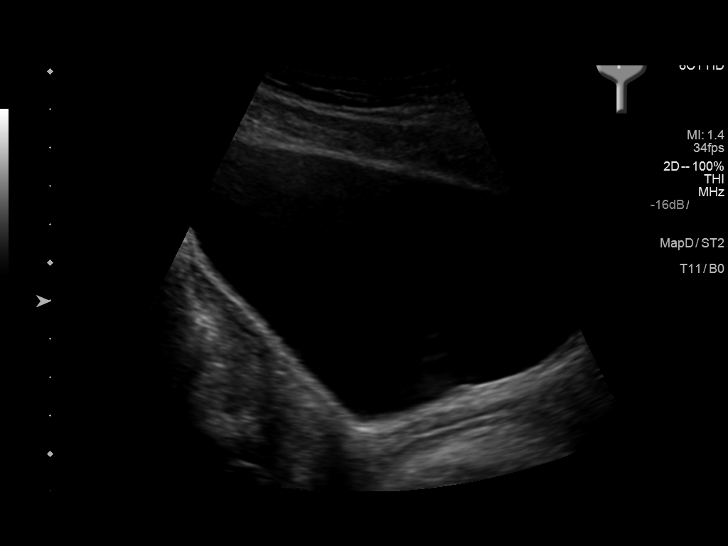
[im 41/45]
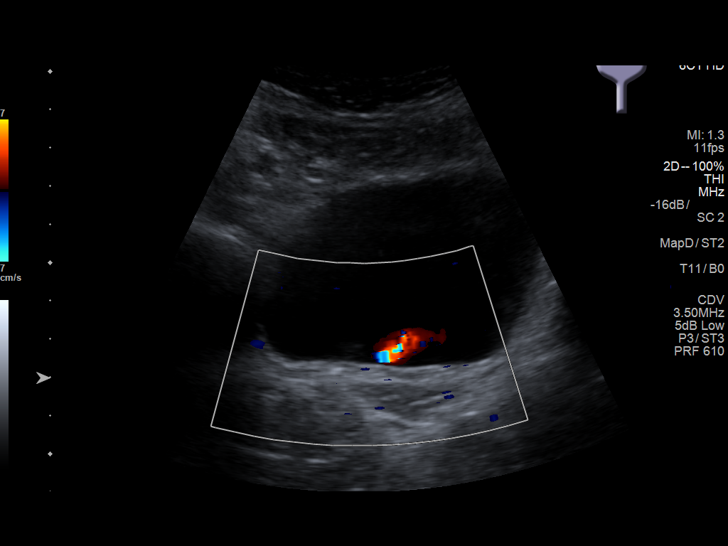
[im 45/45]
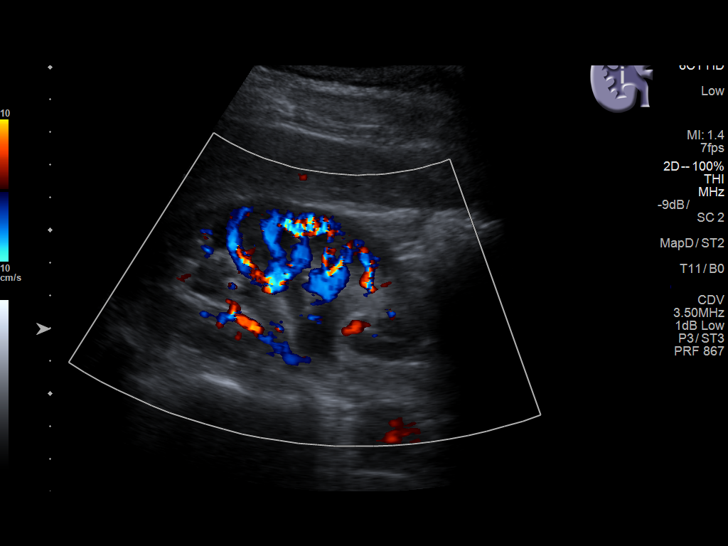

[14 of 25 positions shown; findings below may reference images not displayed]

FINDINGS: Right Kidney:

Length: 11.2 cm. Multiple renal stones, corresponding to the
findings on recent CT. Largest measured stone on today's ultrasound
is 10 mm but this is likely an over-measurement based on the
measurements on recent CT. Mild right-sided hydronephrosis, similar
to recent CT.

Left Kidney:

Length: 10.2 cm.  Multiple stones.  No hydronephrosis.

Bladder:

Appears normal for degree of bladder distention.
IMPRESSION: 1. Bilateral nephrolithiasis.
2. Mild right-sided hydronephrosis.

## 2017-05-25 ENCOUNTER — Ambulatory Visit: Admit: 2017-05-25 | Discharge: 2017-05-25 | Payer: PRIVATE HEALTH INSURANCE

## 2017-05-25 ENCOUNTER — Inpatient Hospital Stay: Admit: 2017-05-25 | Payer: PRIVATE HEALTH INSURANCE

## 2017-05-25 DIAGNOSIS — N202 Calculus of kidney with calculus of ureter: Secondary | ICD-10-CM

## 2017-05-25 DIAGNOSIS — N200 Calculus of kidney: Secondary | ICD-10-CM

## 2017-05-25 NOTE — Unmapped (Signed)
Chief Complaint   Patient presents with   ??? New Patient Visit/ Consultation     Kindney Stones          History of Present Illness  Urinary Calculi    Stone Location(s):  Kidney Stone  Brief History:  Ms. Dock is a pleasant 21yF with history of nephrolithiasis presenting for evaluation of same. Her father is a patient of mine with kidney stones, ureteral reimplant, and prostate cancer. She presented to a hospital in [place] in January with a 5mm distal ureteral stone. CT showed bilateral renal stones with three on left and 6 stones on right, largest on right was 10mm. She underwent URS/LL for the distal stone. The right 10mm stone was treated with ESWL. She subsequently passed two stones and is here for further evaluation and stone treatment.     Interim History:  Since her surgery she has developed intermittent left back pain which is now radiating to her left lower quadrant. Pain can be extreme when it comes on, but resolves. No dysuria, no hematuria.     Types of Stones:  Kidney Stone  Location and Type:  Bilateral, largest was 10mm in size, but now s/p ESWL    Size (Largest/mm):  10    History:    Symptomatic distal ureteral stone, now with LLQ pain with concern for passing left stone.     Severity:    Pain Score:  Moderate pain intermittently   Initial Loc:  Left back  Moved to:  Left lower quadrant   Remains:  lower quandrant  Abated:  Occurs intermittently     Associated Symptoms:  Complains of:  none  Denies:  nausea, vomitting, fever, chills, hematuria and dysuria    Hydronephrosis present:  No  Gross Hematuria:  No  Microscopic Hematuria:  Yes    Alleviating Factors:  spontaneous abated      Radiological Findings:  non contrast CT:  report from OSH with 6 right stones, 3 left stones, 5mm distal ureteral stone     Prior Treatment:  ESWL and Ureteroscopy w/stone extraction      Review of Systems   Constitutional: Negative for chills and diaphoresis.   HENT: Negative for ear discharge.    Eyes:  Negative for pain, redness and itching.   Respiratory: Negative for cough, choking and shortness of breath.    Cardiovascular: Negative for chest pain and leg swelling.   Gastrointestinal: Negative for abdominal pain, anal bleeding and bloating.   Genitourinary: Positive for flank pain (left). Negative for frequency, nocturia and pelvic pain.   Musculoskeletal: Negative for back pain, gait problem, joint swelling, neck pain and neck stiffness.   Skin: Negative for pallor and rash.   Neurological: Negative for seizures and numbness.   Psychiatric/Behavioral: Negative for depression and dysphoric mood.   All other systems reviewed and are negative.      Allergies  Patient has no known allergies.    Medications  No outpatient encounter prescriptions on file as of 05/25/2017.     No facility-administered encounter medications on file as of 05/25/2017.         Histories  She has a past medical history of Kidney stone.    She has a past surgical history that includes Hip surgery (Left, 2015). Left URS/LL, Right ESWL     Her family history includes prostate cancer in her father and breast cancer in mother; Kidney stones in her father.    She reports that she has never smoked.   She has never used smokeless tobacco. She reports that she does not drink alcohol or use drugs.    The following portions of the patient's history were reviewed and updated as appropriate: allergies, current medications, past family history, past medical history, past social history, past surgical history and problem list.    Blood pressure 113/65, pulse 96, resp. rate 16, height 5' 7 (1.702 m), weight 150 lb (68 kg).  Physical Exam   Constitutional: She is oriented to person, place, and time. She appears well-developed and well-nourished.   HENT:   Head: Normocephalic and atraumatic.   Eyes: Conjunctivae and EOM are normal.   Neck: Normal range of motion.   Cardiovascular: Normal rate and regular rhythm.    Pulmonary/Chest: Effort normal. No  respiratory distress.   Abdominal: Soft. She exhibits no distension. There is no tenderness.   Musculoskeletal: Normal range of motion. She exhibits no edema.   Neurological: She is alert and oriented to person, place, and time. No cranial nerve deficit.   Skin: Skin is warm and dry.   Psychiatric: She has a normal mood and affect. Her behavior is normal. Judgment and thought content normal.            Assessment  21yF with nephrolithiasis     Plan  1. Will plan on KUB to assess residual stone burden.     Discussed options for stone treatment. Options discussed include:      Extracorporal shockwave lithotripsy - discussed that my opinion is that it is best for kidney stones that are radioopaque and <1cm in size in the kidney or proximal ureter. Cannot do bilateral treatments and difficult to do more than one stone at a time. Discussed risks of bleeding, infection, damage to surrounding structures. Discussed that some stones respond better to ESWL, primarily calcium oxalate monohydrate stones may not break up as easily. Discussed that all stone fragments will have to pass on their own and there may be pain associated with this.      Ureteroscopy with laser lithotripsy is another treatment option. I think it is best for multiple stones and mid to distal ureteral stones and patients on blood thinners. Discussed risks of bleeding, infection, damage to structures, risk that instruments will not pass with primary ureteroscopy in which case patient will need two weeks of passive dilation with stent and staged procedure for stone procedure.     2. Discussed dietary modifications for calcium stones:    1. Drink lots of water - goal is 2.5-3L of water intake per day with goal UOP of 2-2.5L.Urine should always appear clear and light in color. If there are fluid restrictions due to other medical conditions that should be strictly followed.      2. Avoid excess sodium intake, intake should be <2000mg per day. Foods with high  sodium include soda, processed food, prepared or frozen meals, restaurant foods, fast food. High sodium intake will result in increased calcium in your kidney.      3. Restrict meat intake to 2 servings per day. One serving is 4-6 ounces of meat.      4. Monitor your oxalate intake and eat high oxalate foods judiciously. High oxalate foods include some nuts, beets, rhubarb, chard, etc.      5. Ok to eat calcium rich foods such as cheese and dairy products. Do not restrict these in your diet. You should avoid vitamin C supplements above what is found in your daily multivitamin. Ok to eat oranges or   other vitamin C containing foods.      Will obtain a KUB and plan treatment from there.       EDIT: KUB with right sided large stone and multiple punctate stones, difficult to see any stones on left    Medical Decision Making  The following items were considered in medical decision making:  Review / order clinical lab tests  Review / order radiology tests  Review / order other diagnostic tests/interventions  Reviewed outside records  Independent review of radiologic images

## 2017-05-25 NOTE — Unmapped (Signed)
Chief Complaint   Patient presents with   ??? New Patient Visit/ Consultation     Kindney Stones          History of Present Illness  Urinary Calculi    Stone Location(s):  Kidney Stone  Brief History:  Heidi Casey is a pleasant 21yF with history of nephrolithiasis presenting for evaluation of same. Her father is a patient of mine with kidney stones, ureteral reimplant, and prostate cancer. She presented to a hospital in [place] in January with a 5mm distal ureteral stone. CT showed bilateral renal stones with three on left and 6 stones on right, largest on right was 10mm. She underwent URS/LL for the distal stone. The right 10mm stone was treated with ESWL. She subsequently passed two stones and is here for further evaluation and stone treatment.     Interim History:  Since her surgery she has developed intermittent left back pain which is now radiating to her left lower quadrant. Pain can be extreme when it comes on, but resolves. No dysuria, no hematuria.     Types of Stones:  Kidney Stone  Location and Type:  Bilateral, largest was 10mm in size, but now s/p ESWL    Size (Largest/mm):  10    History:    Symptomatic distal ureteral stone, now with LLQ pain with concern for passing left stone.     Severity:    Pain Score:  Moderate pain intermittently   Initial Loc:  Left back  Moved to:  Left lower quadrant   Remains:  lower quandrant  Abated:  Occurs intermittently     Associated Symptoms:  Complains of:  none  Denies:  nausea, vomitting, fever, chills, hematuria and dysuria    Hydronephrosis present:  No  Gross Hematuria:  No  Microscopic Hematuria:  Yes    Alleviating Factors:  spontaneous abated      Radiological Findings:  non contrast CT:  report from OSH with 6 right stones, 3 left stones, 5mm distal ureteral stone     Prior Treatment:  ESWL and Ureteroscopy w/stone extraction      Review of Systems   Constitutional: Negative for chills and diaphoresis.   HENT: Negative for ear discharge.    Eyes:  Negative for pain, redness and itching.   Respiratory: Negative for cough, choking and shortness of breath.    Cardiovascular: Negative for chest pain and leg swelling.   Gastrointestinal: Negative for abdominal pain, anal bleeding and bloating.   Genitourinary: Positive for flank pain (left). Negative for frequency, nocturia and pelvic pain.   Musculoskeletal: Negative for back pain, gait problem, joint swelling, neck pain and neck stiffness.   Skin: Negative for pallor and rash.   Neurological: Negative for seizures and numbness.   Psychiatric/Behavioral: Negative for depression and dysphoric mood.   All other systems reviewed and are negative.      Allergies  Patient has no known allergies.    Medications  No outpatient encounter prescriptions on file as of 05/25/2017.     No facility-administered encounter medications on file as of 05/25/2017.         Histories  She has a past medical history of Kidney stone.    She has a past surgical history that includes Hip surgery (Left, 2015). Left URS/LL, Right ESWL     Her family history includes prostate cancer in her father and breast cancer in mother; Kidney stones in her father.    She reports that she has never smoked.  She has never used smokeless tobacco. She reports that she does not drink alcohol or use drugs.    The following portions of the patient's history were reviewed and updated as appropriate: allergies, current medications, past family history, past medical history, past social history, past surgical history and problem list.    Blood pressure 113/65, pulse 96, resp. rate 16, height 5' 7 (1.702 m), weight 150 lb (68 kg).  Physical Exam   Constitutional: She is oriented to person, place, and time. She appears well-developed and well-nourished.   HENT:   Head: Normocephalic and atraumatic.   Eyes: Conjunctivae and EOM are normal.   Neck: Normal range of motion.   Cardiovascular: Normal rate and regular rhythm.    Pulmonary/Chest: Effort normal. No  respiratory distress.   Abdominal: Soft. She exhibits no distension. There is no tenderness.   Musculoskeletal: Normal range of motion. She exhibits no edema.   Neurological: She is alert and oriented to person, place, and time. No cranial nerve deficit.   Skin: Skin is warm and dry.   Psychiatric: She has a normal mood and affect. Her behavior is normal. Judgment and thought content normal.            Assessment  21yF with nephrolithiasis     Plan  1. Will plan on KUB to assess residual stone burden.     Discussed options for stone treatment. Options discussed include:      Extracorporal shockwave lithotripsy - discussed that my opinion is that it is best for kidney stones that are radioopaque and <1cm in size in the kidney or proximal ureter. Cannot do bilateral treatments and difficult to do more than one stone at a time. Discussed risks of bleeding, infection, damage to surrounding structures. Discussed that some stones respond better to ESWL, primarily calcium oxalate monohydrate stones may not break up as easily. Discussed that all stone fragments will have to pass on their own and there may be pain associated with this.      Ureteroscopy with laser lithotripsy is another treatment option. I think it is best for multiple stones and mid to distal ureteral stones and patients on blood thinners. Discussed risks of bleeding, infection, damage to structures, risk that instruments will not pass with primary ureteroscopy in which case patient will need two weeks of passive dilation with stent and staged procedure for stone procedure.     2. Discussed dietary modifications for calcium stones:    1. Drink lots of water - goal is 2.5-3L of water intake per day with goal UOP of 2-2.5L.Urine should always appear clear and light in color. If there are fluid restrictions due to other medical conditions that should be strictly followed.      2. Avoid excess sodium intake, intake should be 2000mg  per day. Foods with high  sodium include soda, processed food, prepared or frozen meals, restaurant foods, fast food. High sodium intake will result in increased calcium in your kidney.      3. Restrict meat intake to 2 servings per day. One serving is 4-6 ounces of meat.      4. Monitor your oxalate intake and eat high oxalate foods judiciously. High oxalate foods include some nuts, beets, rhubarb, chard, etc.      5. Ok to eat calcium rich foods such as cheese and dairy products. Do not restrict these in your diet. You should avoid vitamin C supplements above what is found in your daily multivitamin. Ok to eat oranges or  other vitamin C containing foods.      Will obtain a KUB and plan treatment from there.       EDIT: KUB with right sided large stone and multiple punctate stones, difficult to see any stones on left    Medical Decision Making  The following items were considered in medical decision making:  Review / order clinical lab tests  Review / order radiology tests  Review / order other diagnostic tests/interventions  Reviewed outside records  Independent review of radiologic images

## 2017-05-30 NOTE — Unmapped (Signed)
Pt calling to request a return call from MD. She is returning calls from MD last week. Pt is needing to resched surgery. Pleas advise.

## 2017-06-05 MED ORDER — bacitracin-polymyxin b (POLYSPORIN) 500-10,000 unit/gram ointment
500-10000 | TOPICAL | Status: AC
Start: 2017-06-05 — End: ?

## 2017-06-05 MED FILL — BACITRACIN 500 UNIT-POLYMYXIN B 10,000 UNIT/GRAM TOPICAL OINTMENT: 500-10000 500-10,000 unit/gram | TOPICAL | Qty: 56.7

## 2017-06-20 NOTE — Unmapped (Signed)
instr to vm,arrival time 0955 on 06/23/17,NPO after MN.

## 2017-06-22 NOTE — Other (Signed)
New arrival time (0800) procedure time (1000) on 06/23/2017 left on mobile VM (home phone busy) with call back number.

## 2017-06-22 NOTE — Unmapped (Addendum)
[facility]  DEPARTMENT OF ANESTHESIOLOGY  PRE-PROCEDURAL EVALUATION    Heidi Casey is a 21 y.o. year old female presenting for:    Procedure(s):  EXTRACORPOREAL SHOCK WAVE LITHORTRIPSY    Surgeon:   Gillis Ends    Chief Complaint     Kidney stones [N20.0]    Review of Systems     Anesthesia Evaluation         No history of anesthetic complications         Cardiovascular:    Exercise tolerance:  Duke Met score: 4 - Raking leaves. Weeding or pushing a power mower.  (-) hypertension, dysrhythmias.    Neuro/Muscoloskeletal/Psych:      (-) seizures, CVA, psychiatric history, arthritis, back problems.     Pulmonary:      (-) no pneumonia, asthma, recent URI.   ROS comment: Non smoker      GI/Hepatic/Renal:    Chronic renal disease (h/o kidney stones).  (-) GERD, hepatitis, liver disease.    Comments: . CT showed bilateral renal stones with three on left and 6 stones on right, largest on right was 10mm.        underwent URS/LL for the distal stone.     The right 10mm stone was treated with ESWL.      for further evaluation and stone treatment    Endo/Other:        (-) diabetes mellitus, hypothyroidism, no anemia.       Past Medical History     Past Medical History:   Diagnosis Date   ??? Kidney stone        Past Surgical History     Past Surgical History:   Procedure Laterality Date   ??? HIP SURGERY Left 2015       Family History     Family History   Problem Relation Age of Onset   ??? Cancer Father      prostate   ??? Kidney disease Father    ??? Cancer Mother      breast, colon       Social History     Social History     Social History   ??? Marital status: Single     Spouse name: N/A   ??? Number of children: N/A   ??? Years of education: N/A     Occupational History   ??? Not on file.     Social History Main Topics   ??? Smoking status: Never Smoker   ??? Smokeless tobacco: Never Used   ??? Alcohol use No   ??? Drug use: No   ??? Sexual activity: Not on file     Other Topics Concern   ??? Caffeine Use No   ??? Occupational Exposure No   ???  Exercise Yes   ??? Seat Belt Yes     Social History Narrative   ??? No narrative on file       Medications     Allergies:  No Known Allergies    Home Meds:  Prior to Admission medications as of 05/25/17 1710   Not on File       Inpatient Meds:  Scheduled:   Continuous:     PRN:     Vital Signs     Wt Readings from Last 3 Encounters:   05/25/17 150 lb (68 kg)     Ht Readings from Last 3 Encounters:   05/25/17 5' 7 (1.702 m)     Temp Readings from Last 3 Encounters:  No data found for Temp     BP Readings from Last 3 Encounters:   05/25/17 113/65     Pulse Readings from Last 3 Encounters:   05/25/17 96     @LASTSAO2 (3)@    Physical Exam     Airway:     Mallampati: II  TM distance: > = 3 FB  Neck ROM: full    Dental:        Pulmonary:       Breath sounds clear to auscultation.       Cardiovascular:     Rhythm: regular    Neuro/Musculoskeletal/Psych:    Mental status: alert and oriented to person, place and time.          Abdominal:       Current OB Status:       Other Findings:        Laboratory Data     No results found for: WBC, HGB, HCT, MCV, PLT    No results found for: ABORH    No results found for: GLUCOSE, BUN, CO2, CREATININE, K, NA, CL, CALCIUM, ALBUMIN, PROT, ALKPHOS, ALT, AST, BILITOT    No results found for: PTT, INR    No results found for: PREGTESTUR, PREGSERUM, HCG, HCGQUANT    Anesthesia Plan     ASA 1           Anesthesia Type:  general.         Intravenous induction.    Anesthetic plan and risks discussed with patient.    Plan, alternatives, and risks of anesthesia, including death, have been explained to and discussed with the patient/legal guardian.  By my assessment, the patient/legal guardian understands and agrees.  Scenario presented in detail.  Questions answered.    Use of blood products discussed with patient whom consented to blood products.   Plan discussed with CRNA and RNSA.

## 2017-06-23 MED ORDER — ketorolac (TORADOL) 10 mg tablet
10 | ORAL_TABLET | Freq: Four times a day (QID) | ORAL | 0 refills | Status: AC | PRN
Start: 2017-06-23 — End: 2017-06-28

## 2017-06-23 MED ORDER — propofol 10 mg/ml (DIPRIVAN) injection
10 | INTRAVENOUS | Status: AC | PRN
Start: 2017-06-23 — End: 2017-06-23
  Administered 2017-06-23: 14:00:00 200 via INTRAVENOUS

## 2017-06-23 MED ORDER — famotidinePFPEPCIDinjection20mg
20 | Freq: Once | INTRAVENOUS | Status: AC
Start: 2017-06-23 — End: 2017-06-23
  Administered 2017-06-23: 13:00:00 20 mg via INTRAVENOUS

## 2017-06-23 MED ORDER — dexamethasone (DECADRON) 4 mg/mL injection
4 | INTRAMUSCULAR | Status: AC
Start: 2017-06-23 — End: ?

## 2017-06-23 MED ORDER — ondansetron (ZOFRAN) injection 4 mg
4 | Freq: Once | INTRAMUSCULAR | Status: AC
Start: 2017-06-23 — End: 2017-06-23
  Administered 2017-06-23: 13:00:00 4 mg via INTRAVENOUS

## 2017-06-23 MED ORDER — perphenazine tablet 4 mg
4 | Freq: Once | ORAL | Status: AC
Start: 2017-06-23 — End: 2017-06-23
  Administered 2017-06-23: 13:00:00 4 mg via ORAL

## 2017-06-23 MED ORDER — HYDROmorphone (DILAUDID) injection Syrg 0.2 mg
0.5 | INTRAMUSCULAR | Status: AC | PRN
Start: 2017-06-23 — End: 2017-06-23

## 2017-06-23 MED ORDER — fentaNYL (SUBLIMAZE) injection
50 | INTRAMUSCULAR | Status: AC | PRN
Start: 2017-06-23 — End: 2017-06-23
  Administered 2017-06-23 (×6): 25 via INTRAVENOUS

## 2017-06-23 MED ORDER — naloxone (NARCAN) injection 0.04 mg
0.4 | INTRAMUSCULAR | Status: AC | PRN
Start: 2017-06-23 — End: 2017-06-23

## 2017-06-23 MED ORDER — HYDROmorphone (DILAUDID) injection Syrg 0.1 mg
0.5 | INTRAMUSCULAR | Status: AC | PRN
Start: 2017-06-23 — End: 2017-06-23

## 2017-06-23 MED ORDER — dexamethasone (DECADRON) injection
4 | INTRAMUSCULAR | Status: AC | PRN
Start: 2017-06-23 — End: 2017-06-23
  Administered 2017-06-23: 14:00:00 4 via INTRAVENOUS

## 2017-06-23 MED ORDER — ketorolac (TORADOL) injection 30 mg
30 | INTRAMUSCULAR | Status: AC | PRN
Start: 2017-06-23 — End: 2017-06-23
  Administered 2017-06-23: 15:00:00 30 via INTRAVENOUS

## 2017-06-23 MED ORDER — gabapentin (NEURONTIN) capsule 900 mg
300 | ORAL | Status: AC | PRN
Start: 2017-06-23 — End: 2017-06-23
  Administered 2017-06-23: 13:00:00 900 mg via ORAL

## 2017-06-23 MED ORDER — ondansetron (ZOFRAN) injection 4 mg
4 | Freq: Three times a day (TID) | INTRAMUSCULAR | Status: AC | PRN
Start: 2017-06-23 — End: 2017-06-23

## 2017-06-23 MED ORDER — lidocaine (PF) 2% (20 mg/mL) Soln 20 mg
20 | Freq: Once | INTRAMUSCULAR | Status: AC | PRN
Start: 2017-06-23 — End: 2017-06-23

## 2017-06-23 MED ORDER — HYDROmorphone (DILAUDID) injection Syrg 0.4 mg
0.5 | INTRAMUSCULAR | Status: AC | PRN
Start: 2017-06-23 — End: 2017-06-23

## 2017-06-23 MED ORDER — lactated Ringers infusion
INTRAVENOUS | Status: AC
Start: 2017-06-23 — End: 2017-06-23
  Administered 2017-06-23: 16:00:00 125 mL/h via INTRAVENOUS

## 2017-06-23 MED ORDER — acetaminophen (TYLENOL) tablet 975 mg
325 | ORAL | Status: AC | PRN
Start: 2017-06-23 — End: 2017-06-23
  Administered 2017-06-23: 13:00:00 975 mg via ORAL

## 2017-06-23 MED ORDER — fentaNYL (SUBLIMAZE) 50 mcg/mL injection
50 | INTRAMUSCULAR | Status: AC
Start: 2017-06-23 — End: ?

## 2017-06-23 MED ORDER — fentaNYL (SUBLIMAZE) injection 50 mcg
50 | INTRAMUSCULAR | Status: AC | PRN
Start: 2017-06-23 — End: 2017-06-23

## 2017-06-23 MED ORDER — fentaNYL (SUBLIMAZE) injection 12.5 mcg
50 | INTRAMUSCULAR | Status: AC | PRN
Start: 2017-06-23 — End: 2017-06-23

## 2017-06-23 MED ORDER — fentaNYL (SUBLIMAZE) injection 25 mcg
50 | INTRAMUSCULAR | Status: AC | PRN
Start: 2017-06-23 — End: 2017-06-23

## 2017-06-23 MED ORDER — ceFAZolin (ANCEF) IVPB 2 g in D5W (duplex)
2 | INTRAVENOUS | Status: AC | PRN
Start: 2017-06-23 — End: 2017-06-23
  Administered 2017-06-23: 14:00:00 2 g via INTRAVENOUS

## 2017-06-23 MED ORDER — midazolam (PF) (VERSED) injection
1 | INTRAMUSCULAR | Status: AC | PRN
Start: 2017-06-23 — End: 2017-06-23
  Administered 2017-06-23: 14:00:00 2 via INTRAVENOUS

## 2017-06-23 MED ORDER — fentaNYL (SUBLIMAZE) injection 6.5 mcg
50 | INTRAMUSCULAR | Status: AC | PRN
Start: 2017-06-23 — End: 2017-06-23

## 2017-06-23 MED ORDER — HYDROmorphone (DILAUDID) injection Syrg 0.6 mg
0.5 | INTRAMUSCULAR | Status: AC | PRN
Start: 2017-06-23 — End: 2017-06-23

## 2017-06-23 MED ORDER — dexamethasone (DECADRON) injection 4 mg
4 | Freq: Once | INTRAMUSCULAR | Status: AC
Start: 2017-06-23 — End: 2017-06-23

## 2017-06-23 MED ORDER — HYDROcodone-acetaminophen (NORCO) 5-325 mg per tablet
5-325 | ORAL_TABLET | Freq: Four times a day (QID) | ORAL | 0 refills | 15.50000 days | Status: AC | PRN
Start: 2017-06-23 — End: 2017-06-30

## 2017-06-23 MED ORDER — tamsulosin (FLOMAX) 0.4 mg Cap
0.4 | ORAL_CAPSULE | Freq: Every evening | ORAL | 1 refills | Status: AC
Start: 2017-06-23 — End: 2017-12-01

## 2017-06-23 MED ORDER — oxyCODONE (ROXICODONE) immediate release tablet 5 mg
5 | Freq: Once | ORAL | Status: AC | PRN
Start: 2017-06-23 — End: 2017-06-23

## 2017-06-23 MED ORDER — lidocaine (PF) 20 mg/mL (2 %) Soln
20 | INTRAVENOUS | Status: AC | PRN
Start: 2017-06-23 — End: 2017-06-23
  Administered 2017-06-23: 14:00:00 80 via INTRAVENOUS

## 2017-06-23 MED ORDER — lactated Ringers infusion
INTRAVENOUS | Status: AC | PRN
Start: 2017-06-23 — End: 2017-06-23
  Administered 2017-06-23: 14:00:00 via INTRAVENOUS

## 2017-06-23 MED ORDER — oxyCODONE (ROXICODONE) immediate release tablet 10 mg
5 | Freq: Once | ORAL | Status: AC | PRN
Start: 2017-06-23 — End: 2017-06-23

## 2017-06-23 MED ORDER — midazolam (PF) (VERSED) 1 mg/mL injection
1 | INTRAMUSCULAR | Status: AC
Start: 2017-06-23 — End: ?

## 2017-06-23 MED FILL — GABAPENTIN 300 MG CAPSULE: 300 300 MG | ORAL | Qty: 3

## 2017-06-23 MED FILL — MIDAZOLAM (PF) 1 MG/ML INJECTION SOLUTION: 1 1 mg/mL | INTRAMUSCULAR | Qty: 2

## 2017-06-23 MED FILL — ONDANSETRON HCL (PF) 4 MG/2 ML INJECTION SOLUTION: 4 4 mg/2 mL | INTRAMUSCULAR | Qty: 2

## 2017-06-23 MED FILL — FAMOTIDINE (PF) 20 MG/2 ML INTRAVENOUS SOLUTION: 20 20 mg/2 mL | INTRAVENOUS | Qty: 2

## 2017-06-23 MED FILL — CEFAZOLIN 2 GRAM/50 ML IN DEXTROSE (ISO-OSMOTIC) INTRAVENOUS PIGGYBACK: 2 2 gram/50 mL | INTRAVENOUS | Qty: 50

## 2017-06-23 MED FILL — DEXAMETHASONE SODIUM PHOSPHATE 4 MG/ML INJECTION SOLUTION: 4 4 mg/mL | INTRAMUSCULAR | Qty: 1

## 2017-06-23 MED FILL — FENTANYL (PF) 50 MCG/ML INJECTION SOLUTION: 50 50 mcg/mL | INTRAMUSCULAR | Qty: 5

## 2017-06-23 MED FILL — PERPHENAZINE 4 MG TABLET: 4 4 MG | ORAL | Qty: 1

## 2017-06-23 MED FILL — TYLENOL 325 MG TABLET: 325 325 mg | ORAL | Qty: 3

## 2017-06-23 NOTE — Unmapped (Signed)
OPERATIVE REPORT    Date of Operation: 06/23/2017     Preoperative Diagnosis:   1. Right Nephrolithiasis    Postoperative Diagnosis:   1. Same    Procedure:    1. Right extracorporal shock wave lithotripsy  2. Diagnostic fluoroscopy    Surgeon:   Gillis Ends, MD    Assistants:   None     Anesthesia: General     Indications: Heidi Casey is a 21 y.o. female who presents with right calculus and the patient is here for ESWL on that side. Informed consent was obtained and the risks, benefits, and details of the procedure were explained to the patient who elected to proceed.    Details of Procedure:  The patient was brought to the operating room and placed in the supine position on the operating room table. Following induction of general anesthesia the patient was intubated without issue. A routine timeout was performed, confirming the patient, procedure, site, risk of fire, patient allergies and confirming that preoperative antibiotics had been administered. The Dornier lithotripter was docked with the calculus at the second focal point. Location: lower pole, ~7mm in size, upper pole ~54mm in size.     A total of 2500 shocks were administered at a rate of 60-90 shocks per minute. 200 were administered at level 1, 100 at level 2, 100 at level 3, 100 at level 4, and 2000 at level 5. Two minute pause taken after first 200 shocks were administered. Shocks administered at 60 shocks/minute and slowly ramped up to 90 shocks per minute. The lower pole stone received 1500 shocks with excellent fragmentation, the upper pole stone received 1000 shocks at power of 5 and rate of 90. Following the procedure, it appeared that the stones demonstrated excellent fragmentation.    The patient tolerated the procedure well, was extubated without issue in the operating room, and was transported to recovery room in stable condition.     Findings: Two stones seen on fluoroscopy, both not seen at end of case     Estimated Blood Loss:  Minimal    Drains: None          Specimens: None    Complications: None           Disposition: The patient was transferred to PACU in good condition and subsequently discharged home. The patient will follow up in approximately 2 weeks in the outpatient setting with a KUB to be done prior to the visit. Pain medication and flomax prescribed.           Gillis Ends, MD

## 2017-06-23 NOTE — Progress Notes (Signed)
Report given from [place] to [place] for lunch relief at 1301. Discharge instructions discussed with patient and family. They verbalized their understanding. Patient stated her nausea was improved and she was ready to go home. PIV removed. Patient had already urinated 200 ml in the bathroom. Transport placed.    Heidi Casey

## 2017-06-23 NOTE — Unmapped (Signed)
H&P reviewed, patient examined, no changes to H&P. Plan for OR for right ESWL today.

## 2017-06-23 NOTE — Unmapped (Signed)
Anesthesia Transfer of Care Note    Patient: Heidi Casey  Procedure(s) Performed: Procedure(s):  EXTRACORPOREAL SHOCK WAVE LITHORTRIPSY    Patient location: PACU    Anesthesia type: general    Airway Device on Arrival to PACU/ICU: Room Air    IV Access: Peripheral    Monitors Recommended to be Used During PACU/ICU: Standard Monitors    Outstanding Issues to Address: None    Level of Consciousness: awake, alert  and oriented    Post vital signs:    Vitals:    06/23/17 1115   BP: 103/50   Pulse: 75   Resp: 11   Temp: 97.4 ??F (36.3 ??C)   SpO2: 98%       Complications: None       Date 06/22/17 0700 - 06/23/17 0659(Not Admitted) 06/23/17 0700 - 06/24/17 0659   Shift 0700-1459 1500-2259 2300-0659 24 Hour Total 0700-1459 1500-2259 2300-0659 24 Hour Total   I  N  T  A  K  E   I.V.     500  (7.3)   500  (7.3)      Volume (mL) (lactated Ringers infusion)     500   500    Shift Total  (mL/kg)     500  (7.3)   500  (7.3)   O  U  T  P  U  T   Shift Total  (mL/kg)           Weight (kg)     68 68 68 68

## 2017-06-23 NOTE — Unmapped (Addendum)
Lithotripsy, Care After  This sheet gives you information about how to care for yourself after your procedure. Your health care provider may also give you more specific instructions. If you have problems or questions, contact your health care provider.  What can I expect after the procedure?  After the procedure, it is common to have:  ?? Some blood in your urine. This should only last for a few days.  ?? Soreness in your back, sides, or upper abdomen for a few days.  ?? Blotches or bruises on your back where the pressure wave entered the skin.  ?? Pain, discomfort, or nausea when pieces (fragments) of the kidney stone move through the tube that carries urine from the kidney to the bladder (ureter). Stone fragments may pass soon after the procedure, but they may continue to pass for up to 4-8 weeks.  ?? If you have severe pain or nausea, contact your health care provider. This may be caused by a large stone that was not broken up, and this may mean that you need more treatment.  ?? Some pain or discomfort during urination.  ?? Some pain or discomfort in the lower abdomen or (in men) at the base of the penis.  Follow these instructions at home:  Medicines  ?? Take over-the-counter and prescription medicines only as told by your health care provider.  ?? If you were prescribed an antibiotic medicine, take it as told by your health care provider. Do not stop taking the antibiotic even if you start to feel better.  ?? Do not drive for 24 hours if you were given a medicine to help you relax (sedative).  ?? Do not drive or use heavy machinery while taking prescription pain medicine.  Eating and drinking  ?? Drink enough water and fluids to keep your urine clear or pale yellow. This helps any remaining pieces of the stone to pass. It can also help prevent new stones from forming.  ?? Eat plenty of fresh fruits and vegetables.  ?? Follow instructions from your health care provider about eating and drinking restrictions. You may be  instructed:  ?? To reduce how much salt (sodium) you eat or drink. Check ingredients and nutrition facts on packaged foods and beverages.  ?? To reduce how much meat you eat.  ?? Eat the recommended amount of calcium for your age and gender. Ask your health care provider how much calcium you should have.  General instructions  ?? Get plenty of rest.  ?? Most people can resume normal activities 1-2 days after the procedure. Ask your health care provider what activities are safe for you.  ?? If directed, strain all urine through the strainer that was provided by your health care provider.  ?? Keep all fragments for your health care provider to see. Any stones that are found may be sent to a medical lab for examination. The stone may be as small as a grain of salt.  ?? Keep all follow-up visits as told by your health care provider. This is important.  Contact a health care provider if:  ?? You have pain that is severe or does not get better with medicine.  ?? You have nausea that is severe or does not go away.  ?? You have blood in your urine longer than your health care provider told you to expect.  ?? You have more blood in your urine.  ?? You have pain during urination that does not go away.  ?? You urinate  more frequently than usual and this does not go away.  ?? You develop a rash or any other possible signs of an allergic reaction.  Get help right away if:  ?? You have severe pain in your back, sides, or upper abdomen.  ?? You have severe pain while urinating.  ?? Your urine is very dark red.  ?? You have blood in your stool (feces).  ?? You cannot pass any urine at all.  ?? You feel a strong urge to urinate after emptying your bladder.  ?? You have a fever or chills.  ?? You develop shortness of breath, difficulty breathing, or chest pain.  ?? You have severe nausea that leads to persistent vomiting.  ?? You faint.  Summary  ?? After this procedure, it is common to have some pain, discomfort, or nausea when pieces (fragments) of the  kidney stone move through the tube that carries urine from the kidney to the bladder (ureter). If this pain or nausea is severe, however, you should contact your health care provider.  ?? Most people can resume normal activities 1-2 days after the procedure. Ask your health care provider what activities are safe for you.  ?? Drink enough water and fluids to keep your urine clear or pale yellow. This helps any remaining pieces of the stone to pass, and it can help prevent new stones from forming.  ?? If directed, strain your urine and keep all fragments for your health care provider to see. Fragments or stones may be as small as a grain of salt.  ?? Get help right away if you have severe pain in your back, sides, or upper abdomen or have severe pain while urinating.  This information is not intended to replace advice given to you by your health care provider. Make sure you discuss any questions you have with your health care provider.  Document Released: 12/04/2007 Document Revised: 10/05/2016 Document Reviewed: 10/05/2016  Elsevier Interactive Patient Education ?? 2017 [employer]  Instructions Following General Anesthesia    You were given medicines that made you unconscious while a procedure was performed. For as long as 24 hours following this procedure, you may have:  ??? Dizziness, weakness, or drowsiness  ??? Sore throat or hoarseness. ??? Nausea or vomiting     For the first 24 hours following anesthesia:  ?? Have a responsible person with you at all times.  ?? Do not participate in any activities where you could become injured for the next 24 hours, or until you feel normal again.  ?? Do not drive a car or operate heavy machinery.  ?? Do not drink alcohol, take sleeping pills, or other medications that cause drowsiness.  ?? Do not sign important papers or make important decisions.  ?? Only take medicine that has been prescribed by your caregiver.  For pain, only take over-the-counter or prescribed medicines for pain,  discomfort, or fever as directed by your caregiver.  ?? If you wear a CPAP/BiPAP device, please wear it anytime that you are resting or feel tired.  ?? You may resume a normal diet unless otherwise directed.  Vomiting may occur if you eat too soon.  When you can drink without vomiting, try water, juice, or soup.  Try solid foods if you feel little or no nausea.  ?? If you have questions or problems that seem related to anesthesia, call the appropriate number below to speak with the on-call anesthesia provider.  ?? [place] [facility]: (  [phone]  ?? [facility] [phone]    SEEK IMMEDIATE MEDICAL CARE IF:   ??? You develop a rash.  ??? You have chest pain.  ??? You continue to feel sick to your stomach (nauseated) or throw up (vomit). ??? You have difficulty breathing.  ??? You develop any allergic reactions.  ??? You are unable to urinate.

## 2017-06-23 NOTE — Unmapped (Signed)
Anesthesia Extubation Criteria:    Airway Device: laryngeal mask airway    Emergence Details:      Smooth      _x_      Stormy       __       Prolonged   __     Extubation Criteria:      Motor strength intact       _x_      Follows commands        _x_      Good airway reflexes      _x_      OP suctioned                  _x_        Follows commands:  Yes     Patient extubated:  Yes

## 2017-06-23 NOTE — Unmapped (Signed)
INTRA-OP POST BRIEFING NOTE: [place]      Specimens:     Prior to leaving the room: Nurse confirmed name of procedure, completion of instrument, sponge & needle counts, reads specimen labels aloud including patient name and addresses any equipment issues? Nurse confirmed wound class. Nurse to surgeon and anesthesia: What are key concerns for recovery and management of the patient?  Yes      Blood products stored at appropriate temperatures prior to return to blood bank (if applicable)? N/A      Patient identification band secured on patient prior to transfer out of the operating room? Yes      Other Comments: Confirmed with Dr. Vickii Penna    Signed: Glean Hess    Date: 06/23/2017    Time: 11:05 AM

## 2017-06-23 NOTE — Unmapped (Signed)
1210   Pt received to [facility] for post op care.  VSS.  C/O pain 2 on 0/10 scale and tolerable.  Denies N&V currently.  PO fluids and crackers given to pt.  Belongings at bedside.  Pt's mother called back for visiting.      1220   Pt with c/o small amt nausea but declines medications at this time.    1245   Getting out of bed and ambulating to restroom.  Voids blood tinged urine.  Meets discharge criteria.  Getting dressed.    1300   Report to Baxter Hire, Charity fundraiser for lunch relief.

## 2017-06-23 NOTE — Unmapped (Signed)
Anesthesia Post Note    Patient: Heidi Casey    Procedure(s) Performed: Procedure(s):  EXTRACORPOREAL SHOCK WAVE LITHORTRIPSY    Anesthesia type: general    Patient location: PACU    Post pain: Adequate analgesia    Post assessment: no apparent anesthetic complications    Last Vitals:   Vitals:    06/23/17 0843 06/23/17 1115 06/23/17 1120 06/23/17 1130   BP: 108/67 103/50 105/55 102/56   BP Location:  Left arm     Patient Position:  Lying     Pulse: 77 75 74 70   Resp: 20 11 11 10    Temp: 97.9 ??F (36.6 ??C) 97.4 ??F (36.3 ??C)     TempSrc: Temporal Temporal     SpO2: 100% 98% 98% 99%   Weight: 150 lb (68 kg)      Height: 5' 7 (1.702 m)           Post vital signs: stable    Level of consciousness: awake and alert     Complications: None

## 2017-07-05 ENCOUNTER — Ambulatory Visit: Payer: PRIVATE HEALTH INSURANCE

## 2017-10-10 ENCOUNTER — Encounter: Payer: Self-pay | Admitting: *Deleted

## 2017-10-10 ENCOUNTER — Other Ambulatory Visit: Payer: Self-pay

## 2017-10-10 ENCOUNTER — Emergency Department: Payer: BLUE CROSS/BLUE SHIELD

## 2017-10-10 ENCOUNTER — Emergency Department
Admission: EM | Admit: 2017-10-10 | Discharge: 2017-10-10 | Disposition: A | Discharge status: Home / Self Care | Payer: BLUE CROSS/BLUE SHIELD | Attending: Emergency Medicine | Admitting: Emergency Medicine

## 2017-10-10 DIAGNOSIS — N200 Calculus of kidney: Secondary | ICD-10-CM | POA: Diagnosis not present

## 2017-10-10 DIAGNOSIS — R110 Nausea: Secondary | ICD-10-CM | POA: Diagnosis not present

## 2017-10-10 DIAGNOSIS — N3000 Acute cystitis without hematuria: Secondary | ICD-10-CM

## 2017-10-10 DIAGNOSIS — N23 Unspecified renal colic: Secondary | ICD-10-CM | POA: Insufficient documentation

## 2017-10-10 DIAGNOSIS — R1032 Left lower quadrant pain: Secondary | ICD-10-CM | POA: Diagnosis present

## 2017-10-10 LAB — COMPREHENSIVE METABOLIC PANEL
ALK PHOS: 51 U/L (ref 38–126)
ALT: 13 U/L — ABNORMAL LOW (ref 14–54)
ANION GAP: 9 (ref 5–15)
AST: 19 U/L (ref 15–41)
Albumin: 4.1 g/dL (ref 3.5–5.0)
BILIRUBIN TOTAL: 0.7 mg/dL (ref 0.3–1.2)
BUN: 10 mg/dL (ref 6–20)
CALCIUM: 9 mg/dL (ref 8.9–10.3)
CO2: 24 mmol/L (ref 22–32)
Chloride: 105 mmol/L (ref 101–111)
Creatinine, Ser: 0.69 mg/dL (ref 0.44–1.00)
GFR calc Af Amer: >60 mL/min (ref >60)
Glucose, Bld: 87 mg/dL (ref 65–99)
POTASSIUM: 3.7 mmol/L (ref 3.5–5.1)
Sodium: 138 mmol/L (ref 135–145)
TOTAL PROTEIN: 7.2 g/dL (ref 6.5–8.1)

## 2017-10-10 LAB — CBC
HEMATOCRIT: 39.8 % (ref 35.0–47.0)
Hemoglobin: 13.5 g/dL (ref 12.0–16.0)
MCH: 30.2 pg (ref 26.0–34.0)
MCHC: 34 g/dL (ref 32.0–36.0)
MCV: 88.9 fL (ref 80.0–100.0)
Platelets: 314 10*3/uL (ref 150–440)
RBC: 4.48 MIL/uL (ref 3.80–5.20)
RDW: 12.9 % (ref 11.5–14.5)
WBC: 6.2 10*3/uL (ref 3.6–11.0)

## 2017-10-10 LAB — URINALYSIS, COMPLETE (UACMP) WITH MICROSCOPIC
BILIRUBIN URINE: NEGATIVE
GLUCOSE, UA: NEGATIVE mg/dL
HGB URINE DIPSTICK: NEGATIVE
Ketones, ur: NEGATIVE mg/dL
NITRITE: NEGATIVE
PH: 7 (ref 5.0–8.0)
Protein, ur: NEGATIVE mg/dL
Specific Gravity, Urine: 1.003 — ABNORMAL LOW (ref 1.005–1.030)

## 2017-10-10 LAB — POC URINE PREG, ED: PREG TEST UR: NEGATIVE

## 2017-10-10 MED ORDER — ONDANSETRON 4 MG PO TBDP: 4.0000 mg | ORAL_TABLET | Freq: Three times a day (TID) | ORAL | 0 refills | Status: AC | PRN

## 2017-10-10 MED ORDER — SULFAMETHOXAZOLE-TRIMETHOPRIM 800-160 MG PO TABS: 1.0000 | ORAL_TABLET | Freq: Two times a day (BID) | ORAL | 0 refills | Status: DC

## 2017-10-10 MED ORDER — SODIUM CHLORIDE 0.9 % IV BOLUS (SEPSIS)
1000.0000 mL | Freq: Once | INTRAVENOUS | Status: AC
  Administered 2017-10-10: 1000 mL via INTRAVENOUS

## 2017-10-10 MED ORDER — OXYCODONE-ACETAMINOPHEN 5-325 MG PO TABS: 1.0000 | ORAL_TABLET | Freq: Four times a day (QID) | ORAL | 0 refills | Status: AC | PRN

## 2017-10-10 MED ORDER — KETOROLAC TROMETHAMINE 10 MG PO TABS: 10.0000 mg | ORAL_TABLET | Freq: Three times a day (TID) | ORAL | 0 refills | Status: AC | PRN

## 2017-10-10 MED ORDER — SULFAMETHOXAZOLE-TRIMETHOPRIM 800-160 MG PO TABS: 1.0000 | ORAL_TABLET | Freq: Two times a day (BID) | ORAL | 0 refills | Status: AC

## 2017-10-10 MED ORDER — ONDANSETRON HCL 4 MG/2ML IJ SOLN
4.0000 mg | Freq: Once | INTRAMUSCULAR | Status: AC
  Administered 2017-10-10: 4 mg via INTRAVENOUS
  Filled 2017-10-10: qty 2

## 2017-10-10 NOTE — ED Triage Notes (Signed)
Pt to ED reporting intermittent left sided groin pain since last week. PT reports having a hx of kidney stones and reports she had felt simi.ar pains to today. NO blood noted in urine at this time  But pt reports she has started to feel nauseous when the pain flares. No fevers reports no vomiting and pt currently denies having abd pain or vaginal changes.

## 2017-10-10 NOTE — ED Notes (Signed)
Pt has left flank pain for 5 days.  Intermittent pain.  Hx kidney stones.  Pt also reports nausea.  Pt has urinary frequency.  Pt alert.

## 2017-10-10 NOTE — Discharge Instructions (Signed)
Please drink plenty of fluids stay well-hydrated.  You may take Tylenol or Toradol for mild to moderate pain, and Percocet for severe pain.  Do not drive within 8 hours of taking Percocet.  There is a good chance that your stone will require shockwave therapy, so please stop Toradol on Sunday if you are using this medication for pain as it will preclude you from being able to have this procedure.  Return to the emergency department if you develop severe pain, nausea or vomiting, fever, or any other symptoms concerning to you.

## 2017-10-10 NOTE — ED Provider Notes (Signed)
[facility] Emergency Department Provider Note  ____________________________________________  Time seen: Approximately 5:14 PM  I have reviewed the triage vital signs and the nursing notes.   HISTORY  Chief Complaint Nausea and Groin Pain    HPI Alejandra Lam is a 21 y.o. female with a history of renal colic presenting with left flank and left lower quadrant pain.  The patient reports that for the past 5 days, she has had intermittent severe colicky left flank pain which has now moved down to the left pelvis but radiates backwards to the flank.  Today, she was sitting in class and had associated nausea.  No vomiting, fevers or chills, hematuria, change in vaginal discharge.  When she has the pain, nothing makes it better except for time. Past Medical History:  Diagnosis Date  . Hip dysplasia   . History of kidney stones     There are no active problems to display for this patient.   Past Surgical History:  Procedure Laterality Date  . HIP SURGERY Left 2015   pelvic acetabular osteotomy    Current Outpatient Rx  . Order #: [id number] Class: Print  . Order #: [id number] Class: Print  . Order #: [id number] Class: Print  . Order #: [id number] Class: Print  . Order #: [id number] Class: Print  . Order #: [id number] Class: Print  . Order #: [id number] Class: Historical Med    Allergies Patient has no known allergies.  History reviewed. No pertinent family history.  Social History Social History   Tobacco Use  . Smoking status: Never Smoker  . Smokeless tobacco: Never Used  Substance Use Topics  . Alcohol use: Yes    Comment: 2 times a month  . Drug use: No    Review of Systems Constitutional: No fever/chills. Eyes: No visual changes. ENT: No sore throat. No congestion or rhinorrhea. Cardiovascular: Denies chest pain. Denies palpitations. Respiratory: Denies shortness of breath.  No cough. Gastrointestinal: Positive left flank and left lower quadrant  abdominal pain.  Positive nausea, no vomiting.  No diarrhea.  No constipation. Genitourinary: Negative for dysuria. Musculoskeletal: Negative for back pain. Skin: Negative for rash. Neurological: Negative for headaches. No focal numbness, tingling or weakness.     ____________________________________________   PHYSICAL EXAM:  VITAL SIGNS: ED Triage Vitals  Enc Vitals Group     BP 10/10/17 1556 107/72     Pulse Rate 10/10/17 1556 71     Resp 10/10/17 1556 16     Temp 10/10/17 1556 98.6 F (37 C)     Temp Source 10/10/17 1556 Oral     SpO2 10/10/17 1556 100 %     Weight 10/10/17 1557 150 lb (68 kg)     Height 10/10/17 1557 5\' 7"  (1.702 m)     Head Circumference --      Peak Flow --      Pain Score 10/10/17 1556 2     Pain Loc --      Pain Edu? --      Excl. in GC? --     Constitutional: Alert and oriented. Well appearing and in no acute distress. Answers questions appropriately. Eyes: Conjunctivae are normal.  EOMI. No scleral icterus. Head: Atraumatic. Nose: No congestion/rhinnorhea. Mouth/Throat: Mucous membranes are moist.  Neck: No stridor.  Supple.   Cardiovascular: Normal rate, regular rhythm. No murmurs, rubs or gallops.  Respiratory: Normal respiratory effort.  No accessory muscle use or retractions. Lungs CTAB.  No wheezes, rales or ronchi. Gastrointestinal: No reproducible CVA ttp. Soft, and nondistended.  Mild LLQ pain. No guarding or rebound.  No peritoneal signs. Musculoskeletal: No LE edema. No ttp in the calves or palpable cords.  Negative Homan's sign. Neurologic:  A&Ox3.  Speech is clear.  Face and smile are symmetric.  EOMI.  Moves all extremities well. Skin:  Skin is warm, dry and intact. No rash noted. Psychiatric: Mood and affect are normal. Speech and behavior are normal.  Normal judgement.  ____________________________________________   LABS (all labs ordered are listed, but only abnormal results are displayed)  Labs Reviewed  URINALYSIS,  COMPLETE (UACMP) WITH MICROSCOPIC - Abnormal; Notable for the following components:      Result Value   Color, Urine STRAW (*)    APPearance CLEAR (*)    Specific Gravity, Urine 1.003 (*)    Leukocytes, UA TRACE (*)    Bacteria, UA RARE (*)    Squamous Epithelial / LPF 0-5 (*)    All other components within normal limits  COMPREHENSIVE METABOLIC PANEL - Abnormal; Notable for the following components:   ALT 13 (*)    All other components within normal limits  CBC  POC URINE PREG, ED  POC URINE PREG, ED   ____________________________________________  EKG  Not indicated ____________________________________________  RADIOLOGY  Ct Renal Stone Study  Result Date: 10/10/2017 CLINICAL DATA:  Left lower abdomen pain and flank pain for 5 days. EXAM: CT ABDOMEN AND PELVIS WITHOUT CONTRAST TECHNIQUE: Multidetector CT imaging of the abdomen and pelvis was performed following the standard protocol without IV contrast. COMPARISON:  December 24, 2016 FINDINGS: Lower chest: No acute abnormality. Hepatobiliary: No focal liver abnormality is seen. No gallstones, gallbladder wall thickening, or biliary dilatation. Pancreas: Unremarkable. No pancreatic ductal dilatation or surrounding inflammatory changes. Spleen: Normal in size without focal abnormality. Adrenals/Urinary Tract: There is a 1.2 cm stone in the left renal pelvis with dilatation of the left renal calices consistent with left ureteral pelvic obstruction. Small nonobstructing stones are identified in both kidneys. There is no right hydronephrosis. The left ureter is dilated without focal discrete obstructing stone identified. The bladder is normal. Stomach/Bowel: The stomach is normal. There is no small bowel obstruction. Extensive bowel content is identified in the colon. The appendix is normal. Vascular/Lymphatic: No significant vascular findings are present. No enlarged abdominal or pelvic lymph nodes. Reproductive: Uterus and bilateral adnexa  are unremarkable. Other: None. Musculoskeletal: No acute abnormality is identified. Patient status post prior fixation of the left pelvis. IMPRESSION: 1.2 cm stone in the left renal pelvis with dilatation of the left renal calices consistent with left ureteral pelvic obstruction. The left ureter is dilated without focal discrete obstructing stone identified in the ureter. Bilateral nonobstructing kidney stones are noted. Constipation. Electronically Signed   By: Sherian ReinWei-Chen  Lin M.D.   On: 10/10/2017 17:46    ____________________________________________   PROCEDURES  Procedure(s) performed: None  Procedures  Critical Care performed: No ____________________________________________   INITIAL IMPRESSION / ASSESSMENT AND PLAN / ED COURSE  Pertinent labs & imaging results that were available during my care of the patient were reviewed by me and considered in my medical decision making (see chart for details).  21 y.o. female with a history of renal colic presenting with left flank pain radiating to the left lower quadrant and associated nausea.  The patient is hemodynamically stable and afebrile.  Her urinalysis shows rare bacteria with some leukocyte esterase but no significant red blood cells.  However, the patient may have renal colic, recently passed kidney stone, UTI.  Musculoskeletal pain or  intestinal causes for her pain are possible but less likely.  Ovarian pathology/GU is also possible but again less likely.  ----------------------------------------- 6:59 PM on 10/10/2017 -----------------------------------------  The patient does have a 1.2 cm stone in the left renal calyx with surrounding obstruction.  I have spoken with Dr. Mena Goes, the urologist on call, who feels the patient is likely most amenable to shockwave therapy as an outpatient.  The patient has no fever, no elevation in her white blood cell count, and a normal creatinine.  She has some rare bacteria with some leukocyte  esterase with so we will discharge her home with Bactrim.  For therapy, she will receive pain medication and nausea medication; Flomax is not indicated given the location of the stone.  The patient is safe for discharge at this time and understands return precautions as well as follow-up instructions.  ____________________________________________  FINAL CLINICAL IMPRESSION(S) / ED DIAGNOSES  Final diagnoses:  Renal colic on left side  Acute cystitis without hematuria         NEW MEDICATIONS STARTED DURING THIS VISIT:  This SmartLink is deprecated. Use AVSMEDLIST instead to display the medication list for a patient.    Rockne Menghini, MD 10/10/17 1900

## 2017-10-10 NOTE — ED Notes (Signed)
Formatting of this note might be different from the original.  Pt has left flank pain for 5 days.  Intermittent pain.  Hx kidney stones.  Pt also reports nausea.  Pt has urinary frequency.  Pt alert.  Electronically signed by Teresa Peltonoyne, Amy L, RN at 10/10/2017  4:53 PM EST

## 2017-10-10 NOTE — ED Triage Notes (Signed)
Formatting of this note might be different from the original.  Pt to ED reporting intermittent left sided groin pain since last week. PT reports having a hx of kidney stones and reports she had felt simi.ar pains to today. NO blood noted in urine at this time  But pt reports she has started to feel nauseous when the pain flares. No fevers reports no vomiting and pt currently denies having abd pain or vaginal changes.   Electronically signed by Judithann SheenMartin, Shannon L, RN at 10/10/2017  4:01 PM EST

## 2017-10-10 NOTE — ED Provider Notes (Signed)
Formatting of this note is different from the original.  [facility]  Emergency Department Provider Note    ____________________________________________    Time seen: Approximately 5:14 PM    I have reviewed the triage vital signs and the nursing notes.    HISTORY    Chief Complaint  Nausea and Groin Pain    HPI  Linda Colon is a 21 y.o. female with a history of renal colic presenting with left flank and left lower quadrant pain.  The patient reports that for the past 5 days, she has had intermittent severe colicky left flank pain which has now moved down to the left pelvis but radiates backwards to the flank.  Today, she was sitting in class and had associated nausea.  No vomiting, fevers or chills, hematuria, change in vaginal discharge.  When she has the pain, nothing makes it better except for time.  Past Medical History:   Diagnosis Date   ? Hip dysplasia    ? History of kidney stones      There are no active problems to display for this patient.    Past Surgical History:   Procedure Laterality Date   ? HIP SURGERY Left 2015    pelvic acetabular osteotomy     Current Outpatient Rx   ? Order #: [id number] Class: Print   ? Order #: [id number] Class: Print   ? Order #: [id number] Class: Print   ? Order #: [id number] Class: Print   ? Order #: [id number] Class: Print   ? Order #: [id number] Class: Print   ? Order #: [id number] Class: Historical Med     Allergies  Patient has no known allergies.    History reviewed. No pertinent family history.    Social History  Social History     Tobacco Use   ? Smoking status: Never Smoker   ? Smokeless tobacco: Never Used   Substance Use Topics   ? Alcohol use: Yes     Comment: 2 times a month   ? Drug use: No     Review of Systems  Constitutional: No fever/chills.  Eyes: No visual changes.  ENT: No sore throat. No congestion or rhinorrhea.  Cardiovascular: Denies chest pain. Denies palpitations.  Respiratory: Denies shortness of breath.  No cough.  Gastrointestinal:  Positive left flank and left lower quadrant abdominal pain.  Positive nausea, no vomiting.  No diarrhea.  No constipation.  Genitourinary: Negative for dysuria.  Musculoskeletal: Negative for back pain.  Skin: Negative for rash.  Neurological: Negative for headaches. No focal numbness, tingling or weakness.     ____________________________________________    PHYSICAL EXAM:    VITAL SIGNS:  ED Triage Vitals   Enc Vitals Group      BP 10/10/17 1556 107/72      Pulse Rate 10/10/17 1556 71      Resp 10/10/17 1556 16      Temp 10/10/17 1556 98.6 F (37 C)      Temp Source 10/10/17 1556 Oral      SpO2 10/10/17 1556 100 %      Weight 10/10/17 1557 150 lb (68 kg)      Height 10/10/17 1557 5\' 7"  (1.702 m)      Head Circumference --       Peak Flow --       Pain Score 10/10/17 1556 2      Pain Loc --       Pain Edu? --       Excl. in GC? --  Constitutional: Alert and oriented. Well appearing and in no acute distress. Answers questions appropriately.  Eyes: Conjunctivae are normal.  EOMI. No scleral icterus.  Head: Atraumatic.  Nose: No congestion/rhinnorhea.  Mouth/Throat: Mucous membranes are moist.   Neck: No stridor.  Supple.    Cardiovascular: Normal rate, regular rhythm. No murmurs, rubs or gallops.   Respiratory: Normal respiratory effort.  No accessory muscle use or retractions. Lungs CTAB.  No wheezes, rales or ronchi.  Gastrointestinal: No reproducible CVA ttp. Soft, and nondistended.  Mild LLQ pain. No guarding or rebound.  No peritoneal signs.  Musculoskeletal: No LE edema. No ttp in the calves or palpable cords.  Negative Homan's sign.  Neurologic:  A&Ox3.  Speech is clear.  Face and smile are symmetric.  EOMI.  Moves all extremities well.  Skin:  Skin is warm, dry and intact. No rash noted.  Psychiatric: Mood and affect are normal. Speech and behavior are normal.  Normal judgement.    ____________________________________________    LABS  (all labs ordered are listed, but only abnormal results are  displayed)    Labs Reviewed   URINALYSIS, COMPLETE (UACMP) WITH MICROSCOPIC - Abnormal; Notable for the following components:       Result Value    Color, Urine STRAW (*)     APPearance CLEAR (*)     Specific Gravity, Urine 1.003 (*)     Leukocytes, UA TRACE (*)     Bacteria, UA RARE (*)     Squamous Epithelial / LPF 0-5 (*)     All other components within normal limits   COMPREHENSIVE METABOLIC PANEL - Abnormal; Notable for the following components:    ALT 13 (*)     All other components within normal limits   CBC   POC URINE PREG, ED   POC URINE PREG, ED     ____________________________________________    EKG    Not indicated  ____________________________________________    RADIOLOGY    Ct Renal Stone Study    Result Date: 10/10/2017  CLINICAL DATA:  Left lower abdomen pain and flank pain for 5 days. EXAM: CT ABDOMEN AND PELVIS WITHOUT CONTRAST TECHNIQUE: Multidetector CT imaging of the abdomen and pelvis was performed following the standard protocol without IV contrast. COMPARISON:  December 24, 2016 FINDINGS: Lower chest: No acute abnormality. Hepatobiliary: No focal liver abnormality is seen. No gallstones, gallbladder wall thickening, or biliary dilatation. Pancreas: Unremarkable. No pancreatic ductal dilatation or surrounding inflammatory changes. Spleen: Normal in size without focal abnormality. Adrenals/Urinary Tract: There is a 1.2 cm stone in the left renal pelvis with dilatation of the left renal calices consistent with left ureteral pelvic obstruction. Small nonobstructing stones are identified in both kidneys. There is no right hydronephrosis. The left ureter is dilated without focal discrete obstructing stone identified. The bladder is normal. Stomach/Bowel: The stomach is normal. There is no small bowel obstruction. Extensive bowel content is identified in the colon. The appendix is normal. Vascular/Lymphatic: No significant vascular findings are present. No enlarged abdominal or pelvic lymph nodes.  Reproductive: Uterus and bilateral adnexa are unremarkable. Other: None. Musculoskeletal: No acute abnormality is identified. Patient status post prior fixation of the left pelvis. IMPRESSION: 1.2 cm stone in the left renal pelvis with dilatation of the left renal calices consistent with left ureteral pelvic obstruction. The left ureter is dilated without focal discrete obstructing stone identified in the ureter. Bilateral nonobstructing kidney stones are noted. Constipation. Electronically Signed   By: Sherian ReinWei-Chen  Lin M.D.   On:  10/10/2017 17:46     ____________________________________________    PROCEDURES    Procedure(s) performed: None    Procedures    Critical Care performed: No  ____________________________________________    INITIAL IMPRESSION / ASSESSMENT AND PLAN / ED COURSE    Pertinent labs & imaging results that were available during my care of the patient were reviewed by me and considered in my medical decision making (see chart for details).    21 y.o. female with a history of renal colic presenting with left flank pain radiating to the left lower quadrant and associated nausea.  The patient is hemodynamically stable and afebrile.  Her urinalysis shows rare bacteria with some leukocyte esterase but no significant red blood cells.  However, the patient may have renal colic, recently passed kidney stone, UTI.  Musculoskeletal pain or intestinal causes for her pain are possible but less likely.  Ovarian pathology/GU is also possible but again less likely.    -----------------------------------------  6:59 PM on 10/10/2017  -----------------------------------------    The patient does have a 1.2 cm stone in the left renal calyx with surrounding obstruction.  I have spoken with Dr. Mena Goes, the urologist on call, who feels the patient is likely most amenable to shockwave therapy as an outpatient.  The patient has no fever, no elevation in her white blood cell count, and a normal creatinine.  She has some  rare bacteria with some leukocyte esterase with so we will discharge her home with Bactrim.  For therapy, she will receive pain medication and nausea medication; Flomax is not indicated given the location of the stone.  The patient is safe for discharge at this time and understands return precautions as well as follow-up instructions.    ____________________________________________    FINAL CLINICAL IMPRESSION(S) / ED DIAGNOSES    Final diagnoses:   Renal colic on left side   Acute cystitis without hematuria         NEW MEDICATIONS STARTED DURING THIS VISIT:    This SmartLink is deprecated. Use AVSMEDLIST instead to display the medication list for a patient.      Rockne Menghini, MD  10/10/17 1900    Electronically signed by Rockne Menghini, MD at 10/10/2017  7:00 PM EST

## 2017-11-16 ENCOUNTER — Ambulatory Visit: Admit: 2017-11-16 | Discharge: 2017-11-16 | Payer: PRIVATE HEALTH INSURANCE

## 2017-11-16 DIAGNOSIS — N200 Calculus of kidney: Secondary | ICD-10-CM

## 2017-11-16 NOTE — Progress Notes (Signed)
Chief Complaint   Patient presents with    Nephrolithiasis          History of Present Illness  Ms. Heidi Casey returns to clinic with a new CT scan showing left renal stones. She developed some left flank pain and nausea in November and had a CT scan performed which demonstrated a 1.2cm left pelvis stone as well as two mid pole and two lower pole stones. She has had intermittent issues with nausea and flank pain since that time, nothing severe or uncontrolled with pain medication. No dysuria, no hematuria, no UTI symptoms. She has no other complaints.     Prior note:   Urinary Calculi    Stone Location(s):  Kidney Stone  Brief History:  Ms. Heidi Casey is a pleasant 21yF with history of nephrolithiasis presenting for evaluation of same. Her father is a patient of mine with kidney stones, ureteral reimplant, and prostate cancer. She presented to a hospital in [place] in January with a 5mm distal ureteral stone. CT showed bilateral renal stones with three on left and 6 stones on right, largest on right was 10mm. She underwent URS/LL for the distal stone. The right 10mm stone was treated with ESWL. She subsequently passed two stones and is here for further evaluation and stone treatment.     Interim History:  Since her surgery she has developed intermittent left back pain which is now radiating to her left lower quadrant. Pain can be extreme when it comes on, but resolves. No dysuria, no hematuria.     Types of Stones:  Kidney Stone  Location and Type:  Bilateral, largest was 10mm in size, but now s/p ESWL    Size (Largest/mm):  10    History:    Symptomatic distal ureteral stone, now with LLQ pain with concern for passing left stone.     Severity:    Pain Score:  Moderate pain intermittently   Initial Loc:  Left back  Moved to:  Left lower quadrant   Remains:  lower quandrant  Abated:  Occurs intermittently     Associated Symptoms:  Complains of:  none  Denies:  nausea, vomitting, fever, chills, hematuria and  dysuria    Hydronephrosis present:  No  Gross Hematuria:  No  Microscopic Hematuria:  Yes    Alleviating Factors:  spontaneous abated      Radiological Findings:  non contrast CT:  report from OSH with 6 right stones, 3 left stones, 5mm distal ureteral stone     Prior Treatment:  ESWL and Ureteroscopy w/stone extraction      Review of Systems   Constitutional: Negative for chills and diaphoresis.   HENT: Negative for ear discharge.    Eyes: Negative for pain, redness and itching.   Respiratory: Negative for cough, choking and shortness of breath.    Cardiovascular: Negative for chest pain and leg swelling.   Gastrointestinal: Negative for abdominal pain, anal bleeding and bloating.   Genitourinary: Positive for flank pain (left). Negative for frequency, nocturia and pelvic pain.   Musculoskeletal: Negative for back pain, gait problem, joint swelling, neck pain and neck stiffness.   Skin: Negative for pallor and rash.   Neurological: Negative for seizures and numbness.   Psychiatric/Behavioral: Negative for depression and dysphoric mood.   All other systems reviewed and are negative.      Allergies  Patient has no known allergies.    Medications  Outpatient Encounter Prescriptions as of 11/16/2017   Medication Sig Dispense Refill  norethindrone-e.estradiol-iron (LO LOESTRIN FE) 1 mg-10 mcg (24)/10 mcg (2) Tab Take by mouth daily.      tamsulosin (FLOMAX) 0.4 mg Cap Take 1 capsule (0.4 mg total) by mouth at bedtime. Indications: Urolithiasis 30 capsule 1     No facility-administered encounter medications on file as of 11/16/2017.         Histories  She has a past medical history of Kidney stone.    She has a past surgical history that includes Hip surgery (Left, 2015) and Extracorporeal shock wave lithotripsy (Right, 06/23/2017). Left URS/LL, Right ESWL     Her family history includes prostate cancer in her father and breast cancer in mother; Kidney stones in her father.    She reports that she has never smoked.  She has never used smokeless tobacco. She reports that she does not drink alcohol or use drugs.    The following portions of the patient's history were reviewed and updated as appropriate: allergies, current medications, past family history, past medical history, past social history, past surgical history and problem list.    Resp. rate 16, height 5' 7 (1.702 m), weight 150 lb (68 kg).  Physical Exam   Constitutional: She is oriented to person, place, and time. She appears well-developed and well-nourished.   HENT:   Head: Normocephalic and atraumatic.   Eyes: Conjunctivae and EOM are normal.   Neck: Normal range of motion.   Cardiovascular: Normal rate and regular rhythm.    Pulmonary/Chest: Effort normal. No respiratory distress.   Abdominal: Soft. She exhibits no distension. There is no tenderness.   Musculoskeletal: Normal range of motion. She exhibits no edema.   Neurological: She is alert and oriented to person, place, and time. No cranial nerve deficit.   Skin: Skin is warm and dry.   Psychiatric: She has a normal mood and affect. Her behavior is normal. Judgment and thought content normal.            Assessment  21yF with nephrolithiasis     Plan  1. Reviewed CT scan images from OSH with patient and mother  Discussed options for stone treatment. Options discussed include:      Extracorporal shockwave lithotripsy - discussed that my opinion is that it is best for kidney stones that are radioopaque and <1cm in size in the kidney or proximal ureter. Cannot do bilateral treatments and difficult to do more than one stone at a time. Discussed risks of bleeding, infection, damage to surrounding structures. Discussed that some stones respond better to ESWL, primarily calcium oxalate monohydrate stones may not break up as easily. Discussed that all stone fragments will have to pass on their own and there may be pain associated with this.      Ureteroscopy with laser lithotripsy is another treatment option. I think  it is best for multiple stones and mid to distal ureteral stones and patients on blood thinners. Discussed risks of bleeding, infection, damage to structures, risk that instruments will not pass with primary ureteroscopy in which case patient will need two weeks of passive dilation with stent and staged procedure for stone procedure.     With the multiple stones patient elected to proceed with left URS/LL with possible stent. Understands that she will need stent with passive dilation x2 weeks if unable to pass the instruments.     Medical Decision Making  The following items were considered in medical decision making:  Review / order clinical lab tests  Review / order radiology tests  Review / order other diagnostic tests/interventions  Reviewed outside records  Independent review of radiologic images

## 2017-11-22 ENCOUNTER — Inpatient Hospital Stay: Admit: 2017-11-22

## 2017-11-27 NOTE — Unmapped (Signed)
rec'd a message from Dr. Vickii Penna that patient is in a lot of pain and would like to move her procedure up to a sooner date. LVM for patient offering a surgery date of 12/01/17 left direct line for patient to call

## 2017-11-27 NOTE — Unmapped (Signed)
Rec'd call from patient, she is ok with moving her surgery to 12/01/17.

## 2017-11-29 NOTE — Unmapped (Signed)
instr to pt,arrival time 0700 on 12/01/17,NPO after MN,may take AM meds with a sip of water.

## 2017-12-01 ENCOUNTER — Ambulatory Visit: Admit: 2017-12-01 | Payer: PRIVATE HEALTH INSURANCE

## 2017-12-01 LAB — POC HCG QUALITATIVE, URINE: hCG Qualitative-Clinitek: NEGATIVE

## 2017-12-01 MED ORDER — HYDROmorphone (DILAUDID) injection Syrg 0.2 mg
0.5 | INTRAMUSCULAR | Status: AC | PRN
Start: 2017-12-01 — End: 2017-12-01

## 2017-12-01 MED ORDER — HYDROmorphone (DILAUDID) injection Syrg 0.6 mg
1 | INTRAMUSCULAR | Status: AC | PRN
Start: 2017-12-01 — End: 2017-12-01

## 2017-12-01 MED ORDER — lidocaine (PF) 2% (20 mg/mL) 20 mg/mL (2 %) Soln
20 | INTRAMUSCULAR | Status: AC
Start: 2017-12-01 — End: ?

## 2017-12-01 MED ORDER — fentaNYL (SUBLIMAZE) injection 12.5 mcg
50 | INTRAMUSCULAR | Status: AC | PRN
Start: 2017-12-01 — End: 2017-12-01

## 2017-12-01 MED ORDER — dexamethasone (DECADRON) 4 mg/mL injection
4 | INTRAMUSCULAR | Status: AC
Start: 2017-12-01 — End: ?

## 2017-12-01 MED ORDER — lidocaine (PF) 20 mg/mL (2 %) Soln
20 | INTRAVENOUS | Status: AC | PRN
Start: 2017-12-01 — End: 2017-12-01
  Administered 2017-12-01: 14:00:00 100 via INTRAVENOUS

## 2017-12-01 MED ORDER — tamsulosin (FLOMAX) 0.4 mg Cap
0.4 | ORAL_CAPSULE | Freq: Every evening | ORAL | 1 refills | Status: AC
Start: 2017-12-01 — End: 2017-12-31

## 2017-12-01 MED ORDER — fentaNYLSUBLIMAZEinjection125mcg
50 | INTRAMUSCULAR | Status: AC | PRN
Start: 2017-12-01 — End: 2017-12-01

## 2017-12-01 MED ORDER — ceFAZolin (ANCEF) IVPB 2 g in D5W (duplex)
2 | INTRAVENOUS | Status: AC | PRN
Start: 2017-12-01 — End: 2017-12-01
  Administered 2017-12-01: 14:00:00 2 g via INTRAVENOUS

## 2017-12-01 MED ORDER — propofol 10 mg/ml (DIPRIVAN) injection
10 | INTRAVENOUS | Status: AC | PRN
Start: 2017-12-01 — End: 2017-12-01
  Administered 2017-12-01: 14:00:00 200 via INTRAVENOUS

## 2017-12-01 MED ORDER — ondansetron (ZOFRAN) 4 mg/2 mL injection
4 | INTRAMUSCULAR | Status: AC
Start: 2017-12-01 — End: 2017-12-01
  Administered 2017-12-01: 16:00:00 4 via INTRAVENOUS

## 2017-12-01 MED ORDER — HYDROmorphone (DILAUDID) injection Syrg 0.1 mg
0.5 | INTRAMUSCULAR | Status: AC | PRN
Start: 2017-12-01 — End: 2017-12-01

## 2017-12-01 MED ORDER — HYDROmorphone (DILAUDID) injection Syrg 0.4 mg
1 | INTRAMUSCULAR | Status: AC | PRN
Start: 2017-12-01 — End: 2017-12-01

## 2017-12-01 MED ORDER — dexamethasone (DECADRON) injection
4 | INTRAMUSCULAR | Status: AC | PRN
Start: 2017-12-01 — End: 2017-12-01
  Administered 2017-12-01: 14:00:00 4 via INTRAVENOUS

## 2017-12-01 MED ORDER — fentaNYL (SUBLIMAZE) injection 50 mcg
50 | INTRAMUSCULAR | Status: AC | PRN
Start: 2017-12-01 — End: 2017-12-01

## 2017-12-01 MED ORDER — ciprofloxacin HCl (CIPRO) 500 MG tablet
500 | ORAL_TABLET | Freq: Every day | ORAL | 0 refills | Status: AC
Start: 2017-12-01 — End: 2017-12-09

## 2017-12-01 MED ORDER — ondansetron (ZOFRAN) 4 mg/2 mL injection
4 | INTRAMUSCULAR | Status: AC
Start: 2017-12-01 — End: ?

## 2017-12-01 MED ORDER — proMETHazine (PHENERGAN) injection 12.5 mg
25 | Freq: Every day | INTRAMUSCULAR | Status: AC | PRN
Start: 2017-12-01 — End: 2017-12-01

## 2017-12-01 MED ORDER — midazolam (PF) (VERSED) 1 mg/mL injection
1 | INTRAMUSCULAR | Status: AC
Start: 2017-12-01 — End: ?

## 2017-12-01 MED ORDER — ondansetron (ZOFRAN) injection 4 mg
4 | Freq: Three times a day (TID) | INTRAMUSCULAR | Status: AC | PRN
Start: 2017-12-01 — End: 2017-12-01

## 2017-12-01 MED ORDER — lactated Ringers infusion
INTRAVENOUS | Status: AC
Start: 2017-12-01 — End: 2017-12-01
  Administered 2017-12-01: 14:00:00 via INTRAVENOUS

## 2017-12-01 MED ORDER — fentaNYLSUBLIMAZEinjection25mcg
50 | INTRAMUSCULAR | Status: AC | PRN
Start: 2017-12-01 — End: 2017-12-01

## 2017-12-01 MED ORDER — midazolam (PF) (VERSED) injection
1 | INTRAMUSCULAR | Status: AC | PRN
Start: 2017-12-01 — End: 2017-12-01
  Administered 2017-12-01: 14:00:00 2 via INTRAVENOUS

## 2017-12-01 MED ORDER — propofol 10 mg/ml (DIPRIVAN) 10 mg/mL injection
10 | INTRAVENOUS | Status: AC
Start: 2017-12-01 — End: ?

## 2017-12-01 MED ORDER — fentaNYL (SUBLIMAZE) injection
50 | INTRAMUSCULAR | Status: AC | PRN
Start: 2017-12-01 — End: 2017-12-01
  Administered 2017-12-01 (×4): 25 via INTRAVENOUS

## 2017-12-01 MED ORDER — sodium chloride, irrigation 0.9 % irrigation
0.9 | Status: AC | PRN
Start: 2017-12-01 — End: 2017-12-01
  Administered 2017-12-01: 15:00:00 1500
  Administered 2017-12-01: 14:00:00 500

## 2017-12-01 MED ORDER — oxyCODONE (ROXICODONE) immediate release tablet 10 mg
5 | Freq: Once | ORAL | Status: AC | PRN
Start: 2017-12-01 — End: 2017-12-01

## 2017-12-01 MED ORDER — ondansetron (ZOFRAN) injection
4 | INTRAMUSCULAR | Status: AC | PRN
Start: 2017-12-01 — End: 2017-12-01
  Administered 2017-12-01: 15:00:00 4 via INTRAVENOUS

## 2017-12-01 MED ORDER — fentaNYL (SUBLIMAZE) 50 mcg/mL injection
50 | INTRAMUSCULAR | Status: AC
Start: 2017-12-01 — End: ?

## 2017-12-01 MED ORDER — oxyCODONE (ROXICODONE) 5 MG immediate release tablet
5 | ORAL_TABLET | Freq: Three times a day (TID) | ORAL | 0 refills | 6.00000 days | Status: AC | PRN
Start: 2017-12-01 — End: 2017-12-05

## 2017-12-01 MED ORDER — oxyCODONE (ROXICODONE) immediate release tablet 5 mg
5 | Freq: Once | ORAL | Status: AC | PRN
Start: 2017-12-01 — End: 2017-12-01

## 2017-12-01 MED ORDER — OMNIPAQUE 300 mg/mL (iohexol)
300 | INTRAVENOUS | Status: AC | PRN
Start: 2017-12-01 — End: 2017-12-01
  Administered 2017-12-01: 14:00:00 15 via INTRAVESICAL

## 2017-12-01 MED ORDER — succinylcholine (QUELICIN) 20 mg/mL injection
20 | INTRAMUSCULAR | Status: AC
Start: 2017-12-01 — End: ?

## 2017-12-01 MED ORDER — naloxone (NARCAN) injection 0.04 mg
0.4 | INTRAMUSCULAR | Status: AC | PRN
Start: 2017-12-01 — End: 2017-12-01

## 2017-12-01 MED ORDER — rocuronium (ZEMURON) 10 mg/mL injection
10 | INTRAVENOUS | Status: AC
Start: 2017-12-01 — End: ?

## 2017-12-01 MED ORDER — fentaNYL (SUBLIMAZE) injection 6.5 mcg
50 | INTRAMUSCULAR | Status: AC | PRN
Start: 2017-12-01 — End: 2017-12-01

## 2017-12-01 MED FILL — XYLOCAINE-MPF 20 MG/ML (2 %) INJECTION SOLUTION: 20 20 mg/mL (2 %) | INTRAMUSCULAR | Qty: 5

## 2017-12-01 MED FILL — QUELICIN 20 MG/ML INJECTION SOLUTION: 20 20 mg/mL | INTRAMUSCULAR | Qty: 10

## 2017-12-01 MED FILL — DEXAMETHASONE SODIUM PHOSPHATE 4 MG/ML INJECTION SOLUTION: 4 4 mg/mL | INTRAMUSCULAR | Qty: 1

## 2017-12-01 MED FILL — MIDAZOLAM (PF) 1 MG/ML INJECTION SOLUTION: 1 1 mg/mL | INTRAMUSCULAR | Qty: 2

## 2017-12-01 MED FILL — CEFAZOLIN 2 GRAM/50 ML IN DEXTROSE (ISO-OSMOTIC) INTRAVENOUS PIGGYBACK: 2 2 gram/50 mL | INTRAVENOUS | Qty: 50

## 2017-12-01 MED FILL — PROPOFOL 10 MG/ML INTRAVENOUS EMULSION: 10 10 mg/mL | INTRAVENOUS | Qty: 20

## 2017-12-01 MED FILL — ROCURONIUM 10 MG/ML INTRAVENOUS SOLUTION: 10 10 mg/mL | INTRAVENOUS | Qty: 1

## 2017-12-01 MED FILL — ONDANSETRON HCL (PF) 4 MG/2 ML INJECTION SOLUTION: 4 4 mg/2 mL | INTRAMUSCULAR | Qty: 2

## 2017-12-01 MED FILL — FENTANYL (PF) 50 MCG/ML INJECTION SOLUTION: 50 50 mcg/mL | INTRAMUSCULAR | Qty: 2

## 2017-12-01 NOTE — Unmapped (Signed)
INTRA-OP POST BRIEFING NOTE: Heidi Casey      Specimens:     Prior to leaving the room: Nurse confirmed name of procedure, completion of instrument, sponge & needle counts, reads specimen labels aloud including patient name and addresses any equipment issues? Nurse confirmed wound class. Nurse to surgeon and anesthesia: What are key concerns for recovery and management of the patient?  Yes      Blood products stored at appropriate temperatures prior to return to blood bank (if applicable)? N/A      Patient identification band secured on patient prior to transfer out of the operating room? Yes      Other Comments:     Signed: Hassel Neth    Date: 12/01/2017    Time: 10:24 AM

## 2017-12-01 NOTE — Unmapped (Signed)
UROLOGY H&P NOTE    Patient: Heidi Casey  Admit Date: 12/01/2017  Location: PeriOp/PeriOp    Chief Complaint: Kidney stones    HPI: Heidi Casey is a 22 y.o. female with bilateral nephrolithiasis including a large 1.2cm left UPJ stone. No prior stenting. Patient presents for primary ureteroscopy vs stent placement.    Medical History:  Past Medical History:   Diagnosis Date   ??? Kidney stone        Past Surgical History:   Procedure Laterality Date   ??? EXTRACORPOREAL SHOCK WAVE LITHOTRIPSY Right 06/23/2017    Procedure: EXTRACORPOREAL SHOCK WAVE LITHORTRIPSY;  Surgeon: Gillis Ends;  Location: [facility] OR;  Service: Urology;  Laterality: Right;   ??? HIP SURGERY Left 2015       Medications:   Outpatient Meds:  Current Discharge Medication List      CONTINUE these medications which have NOT CHANGED    Details   ibuprofen (ADVIL,MOTRIN) 200 MG tablet Take 200 mg by mouth every 6 hours as needed for Pain.      norethindrone-e.estradiol-iron (LO LOESTRIN FE) 1 mg-10 mcg (24)/10 mcg (2) Tab Take by mouth daily.      tamsulosin (FLOMAX) 0.4 mg Cap Take 1 capsule (0.4 mg total) by mouth at bedtime. Indications: Urolithiasis  Qty: 30 capsule, Refills: 1    Associated Diagnoses: Kidney stones             Inpatient Meds:  Scheduled:  Continuous:  ??? lactated Ringers       RUE:[id number] (ANCEF) IVPB, fentaNYL **OR** fentaNYL **OR** fentaNYL **OR** fentaNYL **OR** fentaNYL, HYDROmorphone **OR** HYDROmorphone **OR** HYDROmorphone **OR** HYDROmorphone **OR** HYDROmorphone, naloxone, ondansetron, oxyCODONE **OR** oxyCODONE, proMETHazine    Allergies: No Known Allergies    SH:   Social History   Substance Use Topics   ??? Smoking status: Never Smoker   ??? Smokeless tobacco: Never Used   ??? Alcohol use No       FH: Noncontributory.    ROS: As per HPI. Otherwise negative.    Objective:  Vitals:    12/01/17 0802   BP: 111/76   Pulse: 75   Resp: 16   Temp: 98.1 ??F (36.7 ??C)   SpO2: 100%        Physical Examination:  Gen: No apparent distress,  alert and oriented x3  HEENT: Normocephalic, EOMI, mucous membranes pink and moist  CV: RRR  Chest: No respiratory distress  Abd: Soft, non-tender, non-distended   Ext: Warm and well perfused  Neuro: No focal deficits      Labs:  No results for input(s): WBC, HGB, HCT, PLT in the last 72 hours.  No results for input(s): NA, K, CL, CO2, BUN, CREATININE, GLUCOSE, CALCIUM, MG, PHOS in the last 72 hours.  No results for input(s): INR, PROTIME in the last 72 hours.  No results found for: PSA    No results found for: COLORU, CLARITYU, PH, PROTEINUA, PHUR, LABSPEC, GLUCOSEU, BLOODU, LEUKOCYTESUR, NITRITE, BILIRUBINUR, UROBILINOGEN, RBCUA, WBCUA, BACTERIA, AMORPHOUS, CRYSTAL, CASTS    Imaging:   X-ray Comparison Images    Result Date: 11/22/2017  Images associated with this accession number were presented to [place] for comparison to an examination performed here.     X-ray Comparison Images    Result Date: 11/22/2017  Images associated with this accession number were presented to [place] for comparison to an examination performed here.       Assessment:   Heidi Casey is a 22 y.o. female with bilateral nephrolithiasis including a large  1.2cm left UPJ stone. No prior stenting. Patient presents for primary ureteroscopy vs stent placement.    Plan:  - OR today for primary ureteroscopy vs stent placement.  - Abx on call to OR  - Dispo: PACU then home.    Nicholes Mango, MD  12/01/2017

## 2017-12-01 NOTE — Unmapped (Signed)
Urology Operative Note    Date of procedure: 12/01/2017     Preoperative diagnosis: Left renal stones    Postoperative diagnosis: Left renal stones    Procedures:  Cystoscopy with left retrograde pyelogram   Left Ureteroscopy with laser lithotripsy and insertion of DJ ureteral stent 6 French x 24 cm, on string     Surgeon: Gillis Ends, MD    Assistant: Nicholes Mango, MD     Anesthesia: General    EBL: minimal     Complications: none     Findings:   1. No abnormal urothelial lesions   2. Left retrograde with no hydro, no ureteral filling defects, large radioopaque stone at UPJ     Indication for procedure:   Patient is a very pleasant 22 y.o. female with a left UPJ stone. Patient has failed medical expulsive therapy and presents today for definitive treatment. Discussed treatment options and patient has elected to proceed with above procedures.     Procedure:  Patient appropriately identified in the preoperative holding area. Informed consent obtained and all questions answered. Patient was brought to the procedure room and placed supine on the procedure table. Anesthesia was induced and airway secured. Patient was then repositioned in the low lithotomy position and prepped and draped in the usual sterile fashion. A time out was performed to verify patients identity and procedure to be performed.     The rigid cystoscope was then inserted atraumatically into the bladder. Pancystoscopy was performed. The left ureteral orifice was identified and a 61F open ended catheter was used to intubate the opening. A retrograde was performed with above findings. Through the catheter a sensor wire was passed to the kidney. The bladder was emptied and the scope removed leaving the wire intact. Using the 8/10 coaxial dilator a benson wire was passed as a safety wire.      Over the sensor wire the ureteral access sheath was passed. The scope was then passed into the kidney through the sheath.     Stone noted in the UPJ and a 200  micron laser fiber was used to dust the stone into fragments <88mm. Lower pole stones were found and similarly dusted.     The scope was withdrawn down the ureter. No further stones noted. Decision was made to pass a ureteral stent. The scope was removed from the bladder. The bladder was emptied. Under fluoroscopic guidance a 6Fx24cm double j stent was passed to the kidney. Appropriate curl noted in the kidney and bladder on fluoroscopy. Stent string was secured to the patient using mastisol and tegaderm.     Patient was cleaned and dried and repositioned supine. Patient awoken from anesthesia without complication and taken to the PACU in stable condition.     Disposition: home today

## 2017-12-01 NOTE — Unmapped (Signed)
Anesthesia Transfer of Care Note    Patient: Heidi Casey  Procedure(s) Performed: Procedure(s):  CYSTOSCOPY W/ URETEROSCOPY W/ LITHOTRIPSY RETROGRADE PYLEOGRAM STENT PLACMENT    Patient location: PACU    Anesthesia type: general    Airway Device on Arrival to PACU/ICU: Room Air    IV Access: Peripheral    Monitors Recommended to be Used During PACU/ICU: Standard Monitors    Outstanding Issues to Address: None    Level of Consciousness: awake, alert  and oriented    Post vital signs:    Vitals:    12/01/17 1022   BP: 148/75   Pulse: 98   Resp: 14   Temp: 97.9 ??F (36.6 ??C)   SpO2: 96%       Complications: None      Date 11/30/17 0700 - 12/01/17 0659(Not Admitted) 12/01/17 0700 - 12/02/17 0659   Shift 0700-1459 1500-2259 2300-0659 24 Hour Total 0700-1459 1500-2259 2300-0659 24 Hour Total   I  N  T  A  K  E   I.V.  (mL/kg)     750  (11)   750  (11)      Volume (mL) (lactated Ringers infusion)     750   750    Shift Total  (mL/kg)     750  (11)   750  (11)   O  U  T  P  U  T   Shift Total  (mL/kg)           Weight (kg) 68 68 68 68 68 68 68 68

## 2017-12-01 NOTE — Unmapped (Signed)
Patient arrived to [facility]. She was alert and oriented x4. Vitals stable. She reported 1/10 bladder, perineum pain, no nausea no itching. She was able to urinate in the bathroom 250 ml blood tinged urine. She declined any po fluids or crackers. Patient dressed then reported feeling nausea. She vomited about 200 ml emesis. 4 mg of zofran given. Patient reported improved nausea. Family brought to bedside. Discharge instructions and new prescriptions discussed and given to patient and her mother. They verbalized their understanding. PIV removed. Transport placed.    Lamonte Richer

## 2017-12-01 NOTE — Unmapped (Signed)
You have a ureteral stent in your ureter. This is a temporary tube placed to help the ureter heal well after surgery. You can remove this stent at home in 7 days by removing the tape and pulling on the string until the entire stent it out. It may feel weird but there is nothing you can hurt by pulling on the string. It is very important you take the stent out as instructed in order to prevent infection or kidney damage by leaving this stent in too long. Recommend removing stent in a warm shower after taking a pain pill.     You must take a once daily antibiotic until the day AFTER your remove your stent in order to decrease your risk of infection. Continue to take tamsulosin until after the stent is removed. Please call the urology clinic at [phone] if you have any questions.      Activity:    Activity as tolerated, no limitations, walk or exercise daily    Return to work:   May return to work as tolerated    Diet:   Resume home diet, drink plenty of water    Other Instructions:   Take medications as prescribed. No driving while taking narcotic pain medications.    Some blood in urine can be expected after procedure    Go to the ER or call the Urology Resident on call at [phone] if you experience any of the following:  - Worsening pain, nausea, or vomiting not relieved by medications  - Temperature greater than 101.5 F or fever/chills  - Chest pain, shortness of breath, persistent dizziness, swelling in one or both legs  - Inability to urinate    Ureteral Stent Information          You had a ureteral stent placed today. The Ureter is a hollow tube, which drains urine made in the kidney into the bladder.     Stents are soft pliable plastic tubes typically placed to relieve obstructions within the ureter. Obstruction within the ureter causes pain and a back up of urine (swelling) in the kidney. Stent placement bypasses the blockage and allows proper drainage of urine and prevents permanent injury to the  kidney.  Ureteral stents are temporary, and are removed after the obstruction is resolved.     Most ureteral stents can remain in place for up to 3 months. After which they need to be removed or exchanged. Stents left for longer than 3 months are called retained stents. Retained stents tend to become encrusted and calcified.     In certain cases retained stents can cause, life threatening infection, kidney failure, pain, loss of kidney, bleeding and may require complex surgeries to be removed.     Activity:  -Stent placement is a minimally invasive procedure and you may resume your usual activities as tolerated following surgery.  -We suggest that you drink plenty of water to keep the urinary tract flushed, unless your Doctor instructs you to monitor your fluid intake.    What can you expect with a stent in place:  -Blood in the urine (could result in passage of small blood clots).  -Irritation to the bladder lining causing urinary urgency or frequency.  -Discomfort within the flank or bladder.       Your stent has a string on the end of it. Your Urologist instructed you to pull the stent in 7 days. To do this pull the string with a steady force until the entire stent is out.  This is not painful. The stent has coils on either end.

## 2017-12-01 NOTE — Unmapped (Signed)
[facility]  DEPARTMENT OF ANESTHESIOLOGY  PRE-PROCEDURAL EVALUATION    Heidi Casey is a 22 y.o. year old female presenting for:    Procedure(s):  CYSTOSCOPY W/ URETEROSCOPY W/ LITHOTRIPSY    Surgeon:   Gillis Ends    Chief Complaint     Kidney stones [N20.0]    Review of Systems     Anesthesia Evaluation    Patient summary reviewed and nursing notes reviewed.  All other systems reviewed and are negative.     No history of anesthetic complications   I have reviewed the History and Physical Exam, any relevant changes are noted in the anesthesia pre-operative evaluation.      Cardiovascular:        Neuro/Muscoloskeletal/Psych:        Pulmonary:      (-) recent URI.       GI/Hepatic/Renal:    Chronic renal disease (Stones).  (-) GERD.    Endo/Other:          Past Medical History     Past Medical History:   Diagnosis Date   ??? Kidney stone        Past Surgical History     Past Surgical History:   Procedure Laterality Date   ??? EXTRACORPOREAL SHOCK WAVE LITHOTRIPSY Right 06/23/2017    Procedure: EXTRACORPOREAL SHOCK WAVE LITHORTRIPSY;  Surgeon: Gillis Ends;  Location: [facility] OR;  Service: Urology;  Laterality: Right;   ??? HIP SURGERY Left 2015       Family History     Family History   Problem Relation Age of Onset   ??? Cancer Father         prostate   ??? Kidney disease Father    ??? Cancer Mother         breast, colon       Social History     Social History     Social History   ??? Marital status: Single     Spouse name: N/A   ??? Number of children: N/A   ??? Years of education: N/A     Occupational History   ??? Not on file.     Social History Main Topics   ??? Smoking status: Never Smoker   ??? Smokeless tobacco: Never Used   ??? Alcohol use No   ??? Drug use: No   ??? Sexual activity: Not on file     Other Topics Concern   ??? Caffeine Use No   ??? Occupational Exposure No   ??? Exercise Yes   ??? Seat Belt Yes     Social History Narrative   ??? No narrative on file       Medications     Allergies:  No Known Allergies    Home Meds:  Prior to  Admission medications as of 12/01/17 0750   Medication Sig Taking?   ibuprofen (ADVIL,MOTRIN) 200 MG tablet Take 200 mg by mouth every 6 hours as needed for Pain. Yes   norethindrone-e.estradiol-iron (LO LOESTRIN FE) 1 mg-10 mcg (24)/10 mcg (2) Tab Take by mouth daily. Yes   tamsulosin (FLOMAX) 0.4 mg Cap Take 1 capsule (0.4 mg total) by mouth at bedtime. Indications: Urolithiasis Yes       Inpatient Meds:  Scheduled:   Continuous:     PRN: ceFAZolin (ANCEF) IVPB    Vital Signs     Wt Readings from Last 3 Encounters:   12/01/17 150 lb (68 kg)   11/16/17 150 lb (68 kg)   06/23/17  150 lb (68 kg)     Ht Readings from Last 3 Encounters:   12/01/17 5' 7 (1.702 m)   11/16/17 5' 7 (1.702 m)   06/23/17 5' 7 (1.702 m)     Temp Readings from Last 3 Encounters:   12/01/17 98.1 ??F (36.7 ??C) (Temporal)   06/23/17 97.7 ??F (36.5 ??C) (Temporal)     BP Readings from Last 3 Encounters:   12/01/17 111/76   06/23/17 102/56   05/25/17 113/65     Pulse Readings from Last 3 Encounters:   12/01/17 75   06/23/17 94   05/25/17 96     @LASTSAO2 (3)@    Physical Exam     Airway:     Mallampati: II  Mouth Opening: >2 FB  TM distance: > = 3 FB  Neck ROM: full    Dental:        Comment: Fair repair    Pulmonary:       Breath sounds clear to auscultation.       Cardiovascular:     Rhythm: regular  Rate: normal    Neuro/Musculoskeletal/Psych:    Mental status: alert and oriented to person, place and time.          Abdominal:       Current OB Status:       Other Findings:  NPO.      Laboratory Data     No results found for: WBC, HGB, HCT, MCV, PLT    No results found for: ABORH    No results found for: GLUCOSE, BUN, CO2, CREATININE, K, NA, CL, CALCIUM, ALBUMIN, PROT, ALKPHOS, ALT, AST, BILITOT    No results found for: PTT, INR    No results found for: PREGTESTUR, PREGSERUM, HCG, HCGQUANT    Anesthesia Plan     ASA 1           Anesthesia Type:  general.     (Patient and her parents present understand and accept plan and risks IBNLT N/V, teeth/nerve  damage, sore throat, recall/awareness,  invasive monitors/ICUcare/overnight admission. All Q/A in detail.   )    Intravenous induction.    Anesthetic plan and risks discussed with mother, patient and father.    Plan, alternatives, and risks of anesthesia, including death, have been explained to and discussed with the patient/legal guardian.  By my assessment, the patient/legal guardian understands and agrees.  Scenario presented in detail.  Questions answered.    Use of blood products discussed with patient, father and mother whom consented to blood products.   Plan discussed with CRNA and attending.

## 2017-12-01 NOTE — Unmapped (Deleted)
Anesthesia Post Note    Patient: Heidi Casey    Procedure(s) Performed: Procedure(s):  CYSTOSCOPY W/ URETEROSCOPY W/ LITHOTRIPSY RETROGRADE PYLEOGRAM STENT PLACMENT    Anesthesia type: general    Patient location: PACU    Post pain: Adequate analgesia    Post assessment: no apparent anesthetic complications    Last Vitals:   Vitals:    12/01/17 1032 12/01/17 1048 12/01/17 1105 12/01/17 1111   BP: 115/62 116/62 108/66 104/58   BP Location:    Left arm   Patient Position:    Lying   Pulse: 88 74  85   Resp: 17 15  16    Temp:       TempSrc:       SpO2: 100% 100% 100%    Weight:       Height:            Post vital signs: stable    Level of consciousness: awake, alert  and oriented    Complications: None

## 2017-12-01 NOTE — Unmapped (Signed)
Anesthesia Post Note    Patient: Heidi Casey    Procedure(s) Performed: Procedure(s):  CYSTOSCOPY W/ URETEROSCOPY W/ LITHOTRIPSY RETROGRADE PYLEOGRAM STENT PLACMENT    Anesthesia type: general    Patient location: PACU    Post pain: Adequate analgesia    Post assessment: no apparent anesthetic complications    Last Vitals:   Vitals:    11/29/17 0936 12/01/17 0802 12/01/17 1022   BP:  111/76 148/75   Pulse:  75 98   Resp:  16 14   Temp:  98.1 ??F (36.7 ??C) 97.9 ??F (36.6 ??C)   TempSrc:  Temporal Temporal   SpO2:  100% 96%   Weight: 150 lb (68 kg) 150 lb (68 kg)    Height: 5' 7 (1.702 m) 5' 7 (1.702 m)         Post vital signs: stable    Level of consciousness: awake, alert  and oriented    Complications: None

## 2017-12-01 NOTE — Unmapped (Signed)
Anesthesia Extubation Criteria:    Airway Device: laryngeal mask airway    Emergence Details:      Smooth      _x_      Stormy       __       Prolonged   __     Extubation Criteria:      Motor strength intact       _x_      Follows commands        _x_      Good airway reflexes      _x_      OP suctioned                  _x_        Follows commands:  Yes     Patient extubated:  Yes

## 2017-12-28 ENCOUNTER — Ambulatory Visit: Admit: 2017-12-28 | Discharge: 2017-12-28 | Payer: PRIVATE HEALTH INSURANCE

## 2017-12-28 DIAGNOSIS — N200 Calculus of kidney: Secondary | ICD-10-CM

## 2017-12-28 NOTE — Unmapped (Signed)
Dietary modifications for calcium stones:     1. Drink lots of water - goal is 2.5-3L of water intake per day with goal UOP of 2-2.5L.Urine should always appear clear and light in color. If there are fluid restrictions due to other medical conditions that should be strictly followed.     2. Avoid excess sodium (salt) intake, intake should be <2000mg per day. Foods with high sodium include soda, processed food, prepared or frozen meals, restaurant foods, fast food. High sodium intake will result in increased calcium in your kidney.     3. Restrict meat intake to 2 servings per day. One serving is 4-6 ounces of meat.     4. Monitor your oxalate intake and eat high oxalate foods judiciously. High oxalate foods include black tea (green tea as well but much less oxalate), dark beers, nuts, beets, rhubarb, spinach, chard, almonds, etc. If you combine high oxalate foods with high calcium foods in the same meal it will decrease the absorption of the calcium and the oxalate!    5. Ok to eat calcium rich foods such as cheese and dairy products. Do not restrict these in your diet. Aim for 800-1200mg per day. You should avoid vitamin C supplements above what is found in your daily multivitamin. Ok to eat oranges or other vitamin C containing foods.      A good resource is kidneystones.[employer].[facility]     Optional next step is a Litholink 24 hour urine collection to further define your stone risks, would do this in 2-3 months. If you choose to do this call to schedule a followup appointment after you send it in.

## 2017-12-28 NOTE — Unmapped (Signed)
Chief Complaint   Patient presents with   ??? Nephrolithiasis          History of Present Illness  Ms. Drummonds returns to clinic for followup. She had no significant issues postoperatively. She reports that after she removed the stent she passed a lot of sand. She has had prior stone analysis - mixed stone, in [place], she will get [place] the results. She has no flank pain, no hematuria, no dysuria. Feeling much better overall.     Prior note:   Ms. Ayuso returns to clinic with a new CT scan showing left renal stones. She developed some left flank pain and nausea in November and had a CT scan performed which demonstrated a 1.2cm left pelvis stone as well as two mid pole and two lower pole stones. She has had intermittent issues with nausea and flank pain since that time, nothing severe or uncontrolled with pain medication. No dysuria, no hematuria, no UTI symptoms. She has no other complaints.     Prior note:   Urinary Calculi    Stone Location(s):  Kidney Stone  Brief History:  Ms. Sforza is a pleasant 21yF with history of nephrolithiasis presenting for evaluation of same. Her father is a patient of mine with kidney stones, ureteral reimplant, and prostate cancer. She presented to a hospital in [place] in January with a 5mm distal ureteral stone. CT showed bilateral renal stones with three on left and 6 stones on right, largest on right was 10mm. She underwent URS/LL for the distal stone. The right 10mm stone was treated with ESWL. She subsequently passed two stones and is here for further evaluation and stone treatment.     Interim History:  Since her surgery she has developed intermittent left back pain which is now radiating to her left lower quadrant. Pain can be extreme when it comes on, but resolves. No dysuria, no hematuria.     Types of Stones:  Kidney Stone  Location and Type:  Bilateral, largest was 10mm in size, but now s/p ESWL  Size (Largest/mm):  10  History:    Symptomatic distal ureteral stone, now  with LLQ pain with concern for passing left stone.   Severity:    Pain Score:  Moderate pain intermittently   Initial Loc:  Left back  Moved to:  Left lower quadrant   Remains:  lower quandrant  Abated:  Occurs intermittently   Associated Symptoms:  Complains of:  none  Denies:  nausea, vomitting, fever, chills, hematuria and dysuria  Hydronephrosis present:  No  Gross Hematuria:  No  Microscopic Hematuria:  Yes  Alleviating Factors:  spontaneous abated    Radiological Findings:  non contrast CT:  report from OSH with 6 right stones, 3 left stones, 5mm distal ureteral stone   Prior Treatment:  ESWL and Ureteroscopy w/stone extraction      Review of Systems   Constitutional: Negative for chills and diaphoresis.   HENT: Negative for ear discharge.    Eyes: Negative for pain, redness and itching.   Respiratory: Negative for cough, choking and shortness of breath.    Cardiovascular: Negative for chest pain and leg swelling.   Gastrointestinal: Negative for abdominal pain, anal bleeding and bloating.   Genitourinary: Negative for frequency, nocturia and pelvic pain. Flank pain: left.   Musculoskeletal: Negative for back pain, gait problem, joint swelling, neck pain and neck stiffness.   Skin: Negative for pallor and rash.   Neurological: Negative for seizures and numbness.  Psychiatric/Behavioral: Negative for depression and dysphoric mood.   All other systems reviewed and are negative.      Allergies  Patient has no known allergies.    Medications  Outpatient Encounter Prescriptions as of 12/28/2017   Medication Sig Dispense Refill   ??? ibuprofen (ADVIL,MOTRIN) 200 MG tablet Take 200 mg by mouth every 6 hours as needed for Pain.     ??? norethindrone-e.estradiol-iron (LO LOESTRIN FE) 1 mg-10 mcg (24)/10 mcg (2) Tab Take by mouth daily.     ??? tamsulosin (FLOMAX) 0.4 mg Cap Take 1 capsule (0.4 mg total) by mouth at bedtime for 30 days. Indications: Urolithiasis 30 capsule 1     No facility-administered encounter medications  on file as of 12/28/2017.         Histories  She has a past medical history of Kidney stone.    She has a past surgical history that includes Hip surgery (Left, 2015); Extracorporeal shock wave lithotripsy (Right, 06/23/2017); and Cystoscopy w/ ureteroscopy w/ lithotripsy (Left, 12/01/2017). Left URS/LL, Right ESWL     Her family history includes prostate cancer in her father and breast cancer in mother; Kidney stones in her father.    She reports that she has never smoked. She has never used smokeless tobacco. She reports that she does not drink alcohol or use drugs.    The following portions of the patient's history were reviewed and updated as appropriate: allergies, current medications, past family history, past medical history, past social history, past surgical history and problem list.    There were no vitals taken for this visit.  Physical Exam   Constitutional: She is oriented to person, place, and time. She appears well-developed and well-nourished.   HENT:   Head: Normocephalic and atraumatic.   Eyes: Conjunctivae and EOM are normal.   Neck: Normal range of motion.   Cardiovascular: Normal rate and regular rhythm.    Pulmonary/Chest: Effort normal. No respiratory distress.   Abdominal: Soft. She exhibits no distension. There is no tenderness.   Musculoskeletal: Normal range of motion. She exhibits no edema.   Neurological: She is alert and oriented to person, place, and time. No cranial nerve deficit.   Skin: Skin is warm and dry.   Psychiatric: She has a normal mood and affect. Her behavior is normal. Judgment and thought content normal.            Assessment  21yF with nephrolithiasis     Plan  1. Discussed dietary modifications for calcium stones:     1. Drink lots of water - goal is 2.5-3L of water intake per day with goal UOP of 2-2.5L.Urine should always appear clear and light in color. If there are fluid restrictions due to other medical conditions that should be strictly followed.     2. Avoid excess  sodium (salt) intake, intake should be 2000mg  per day. Foods with high sodium include soda, processed food, prepared or frozen meals, restaurant foods, fast food. High sodium intake will result in increased calcium in your kidney.     3. Restrict meat intake to 2 servings per day. One serving is 4-6 ounces of meat.     4. Monitor your oxalate intake and eat high oxalate foods judiciously. High oxalate foods include black tea (green tea as well but much less oxalate), dark beers, nuts, beets, rhubarb, spinach, chard, almonds, etc. If you combine high oxalate foods with high calcium foods in the same meal it will decrease the absorption of the calcium  and the oxalate!    5. Ok to eat calcium rich foods such as cheese and dairy products. Do not restrict these in your diet. Aim for 800-1200mg  per day. You should avoid vitamin C supplements above what is found in your daily multivitamin. Ok to eat oranges or other vitamin C containing foods.      A good resource is kidneystones.[employer].[facility]     6. Next step is a Litholink 24 hour urine collection to further define stone risks.     7. 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 Will do a 24 hour urine and further investigate after that returns.     Medical Decision Making  The following items were considered in medical decision making:  Review / order other diagnostic tests/interventions  Reviewed outside records

## 2018-09-28 IMAGING — CT CT RENAL STONE PROTOCOL
3 of 4 series · 9 of 46 positions shown, 14 images · non-contrast
Comparison: None.

CLINICAL DATA: Right flank pain today.  Microscopic hematuria.

EXAM:
CT ABDOMEN AND PELVIS WITHOUT CONTRAST
TECHNIQUE: Multidetector CT imaging of the abdomen and pelvis was performed
following the standard protocol without IV contrast.

[Series 4: lung bases · axial · 0.63mm/px · z∈[+377,+457]mm · 5 of 26 slices shown, 10 images]
[im 5/26  soft-tissue]
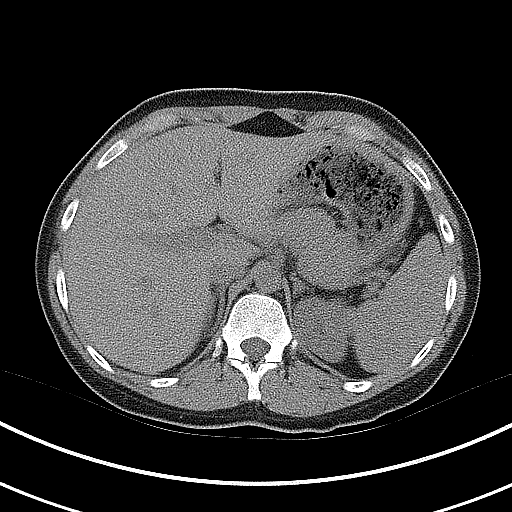
[im 5/26  bone]
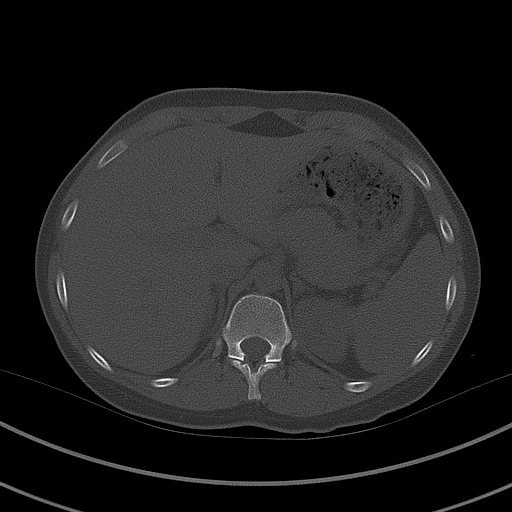
[im 9/26  soft-tissue]
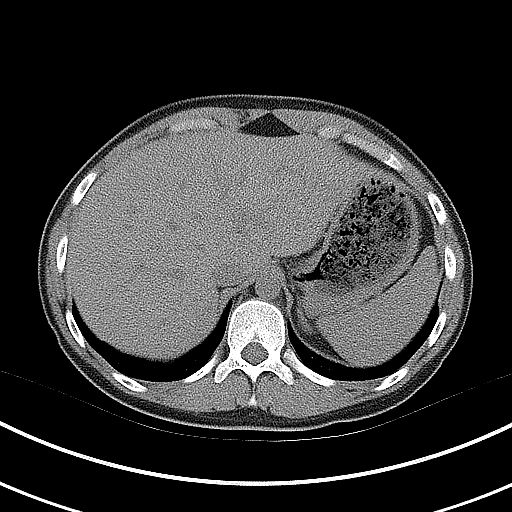
[im 9/26  lung]
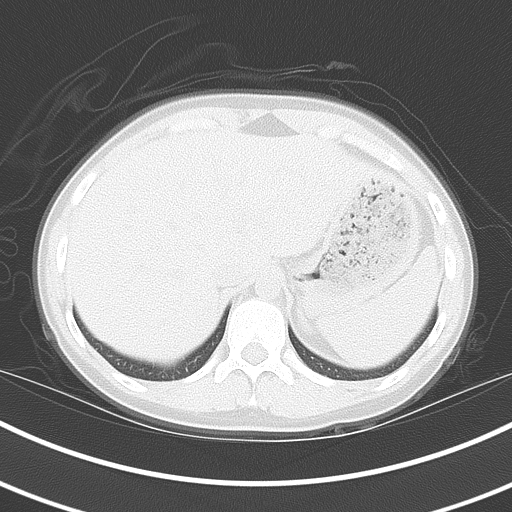
[im 13/26  soft-tissue]
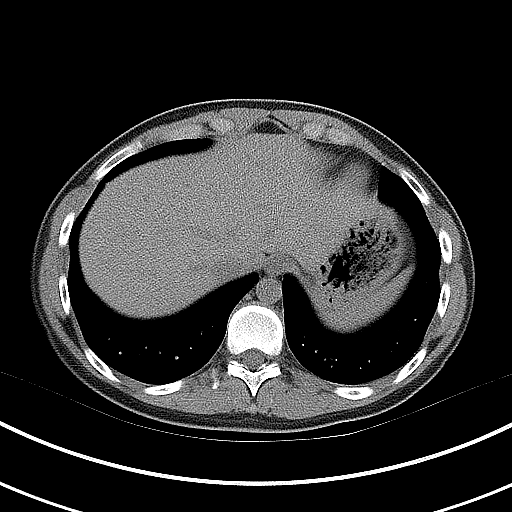
[im 13/26  lung]
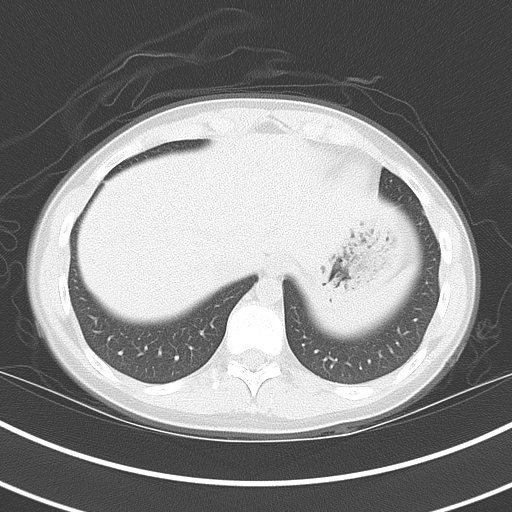
[im 17/26  soft-tissue]
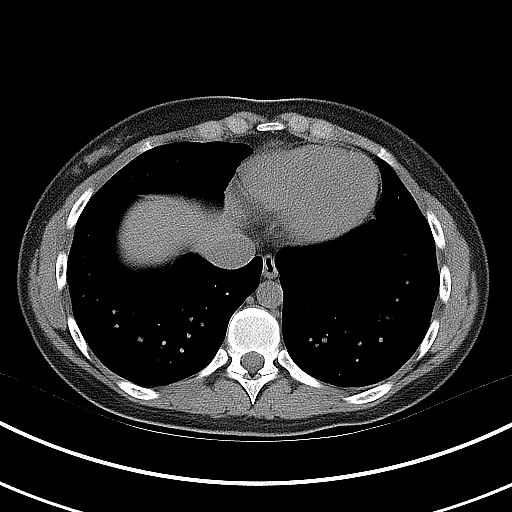
[im 17/26  lung]
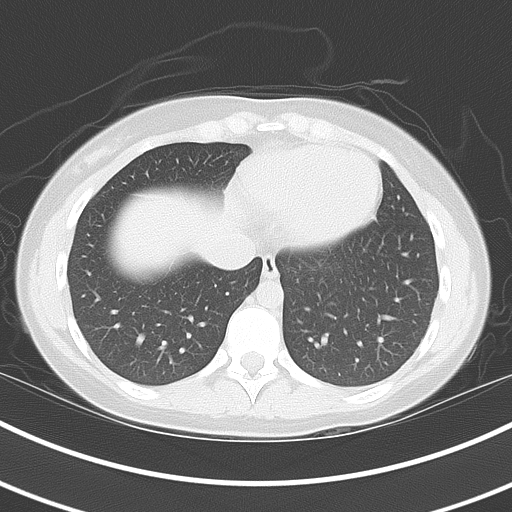
[im 21/26  soft-tissue]
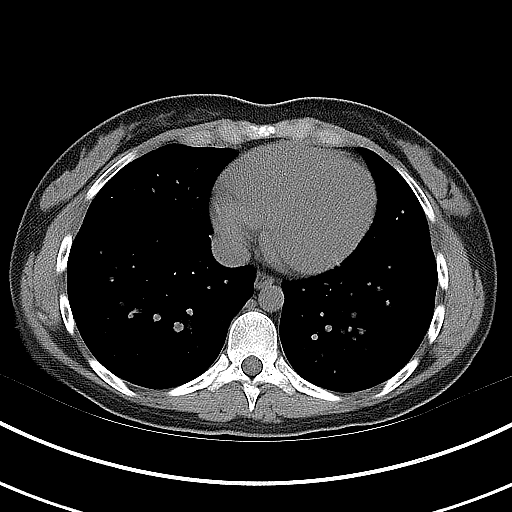
[im 21/26  lung]
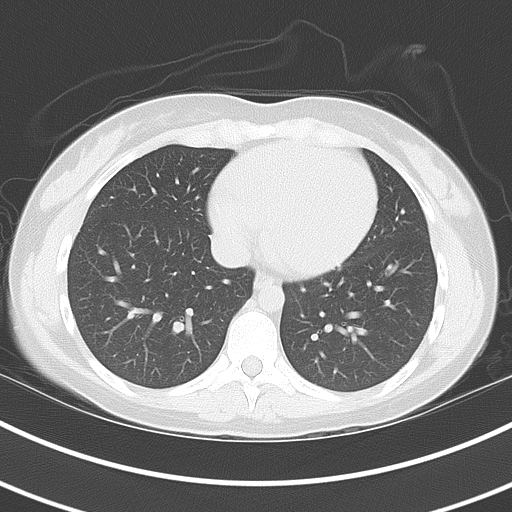

[Series 5: coronal · coronal · 0.61mm/px · 3 of 114 slices shown]
[im 38/114  soft-tissue]
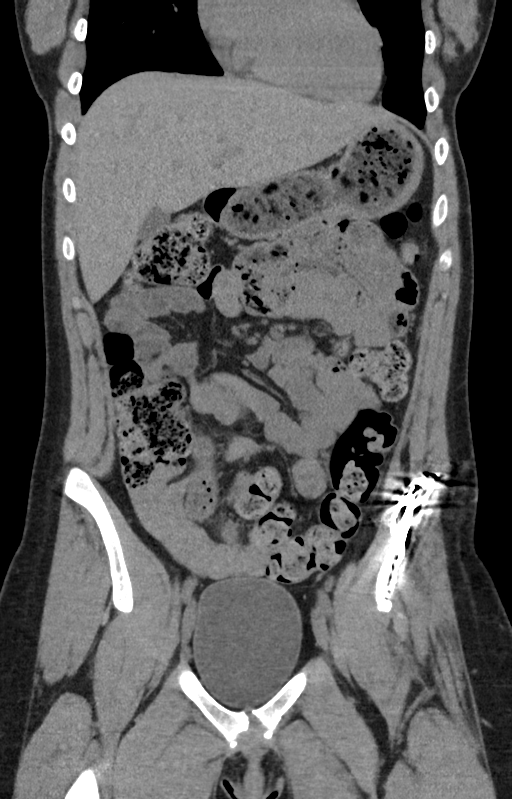
[im 51/114  soft-tissue]
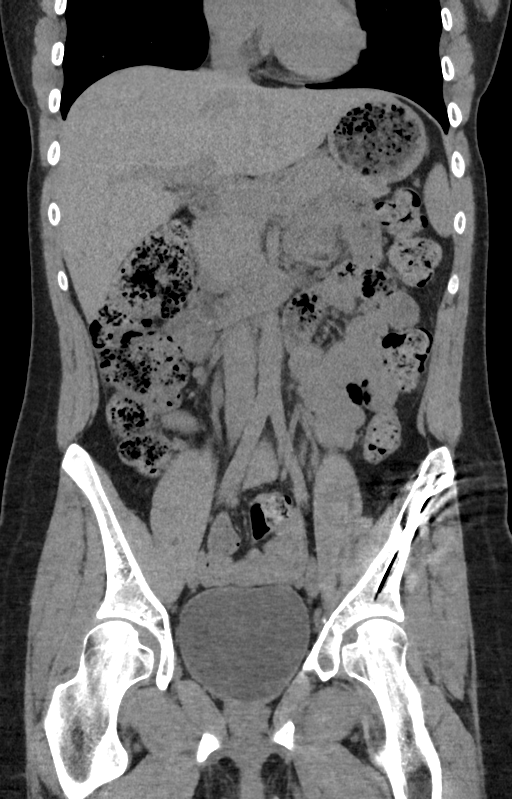
[im 63/114  soft-tissue]
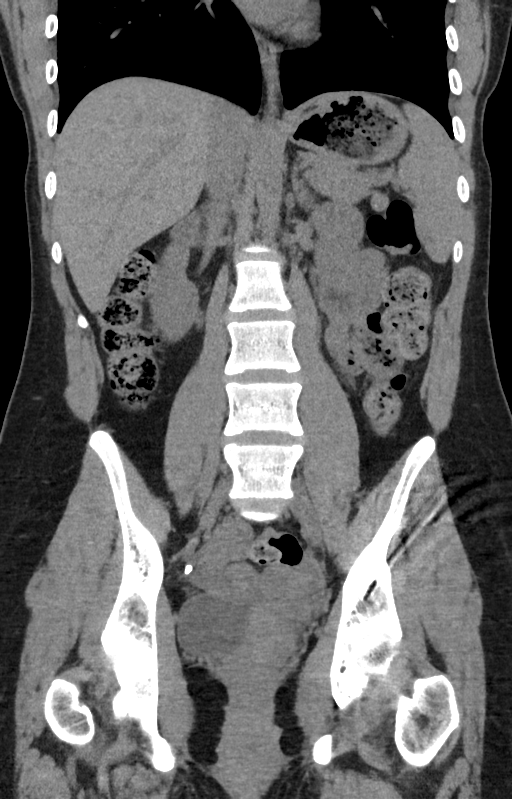

[Series 6: sagittal · sagittal · 0.44mm/px · 1 of 156 slices shown]
[im 52/156  soft-tissue]
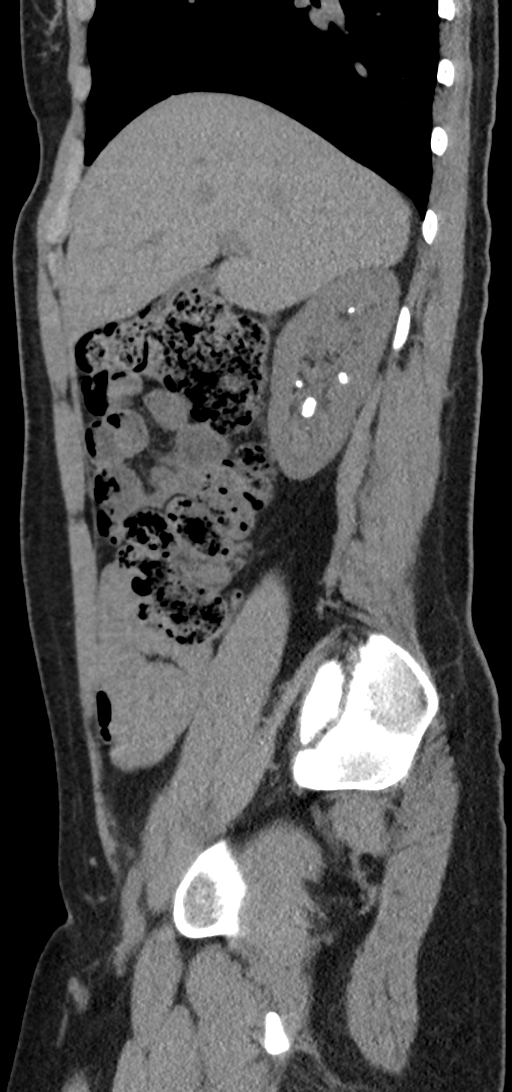

[9 of 46 positions shown; findings below may reference images not displayed]

FINDINGS: Lower chest:  No contributory findings.

Hepatobiliary: No focal liver abnormality.No evidence of biliary
obstruction or stone.

Pancreas: Unremarkable.

Spleen: Unremarkable.

Adrenals/Urinary Tract: Negative adrenals. 5 mm distal right
ureteral stone with mild right hydroureteronephrosis. Multiple
bilateral renal calculi, at least 6 on the right and 3 on the left.
Largest stone is in the right lower pole measuring 10 mm. No left
hydronephrosis. Unremarkable bladder.

Stomach/Bowel: Formed stool throughout the colon without obstruction
or rectal impaction. No appendicitis. No obstruction.

Vascular/Lymphatic: No acute vascular abnormality. No mass or
adenopathy.

Reproductive:No pathologic findings.

Other: No ascites or pneumoperitoneum.

Musculoskeletal: Postoperative left iliac wing. Patient has history
of developmental hip dysplasia and this may be related to
acetabuloplasty.
IMPRESSION: 1. 5 mm distal right ureteral calculus with mild
hydroureteronephrosis.
2. Multiple bilateral renal calculi measuring up to 1 cm on the
right.

## 2019-10-03 NOTE — Addendum Note (Signed)
Addended by: Gwynne Edinger on: 10/07/2019 09:27 AM     Modules accepted: SmartSet      Electronically signed by Gwynne Edinger, NP at 10/07/2019  9:27 AM EST

## 2019-10-03 NOTE — Progress Notes (Signed)
Formatting of this note might be different from the original.    Self Swab Type: Anterior Nasal  Electronically signed by Geraldine Contras, RPH at 10/03/2019  4:35 PM EST

## 2019-10-09 ENCOUNTER — Ambulatory Visit: Payer: PRIVATE HEALTH INSURANCE

## 2019-11-04 ENCOUNTER — Ambulatory Visit: Payer: PRIVATE HEALTH INSURANCE

## 2019-11-04 NOTE — Telephone Encounter (Signed)
Pt rescheduled original appt back in sept for dec 7th appt of which we shut down due to covid surge, asking when to have them come in for new pt appt.  Pt did have 18 month wcc.  We would be looking out to feb or so.

## 2019-11-04 NOTE — Telephone Encounter (Signed)
Patient called to reschedule her appointment

## 2019-11-04 NOTE — Telephone Encounter (Signed)
No she did not reschedule or did I update her phone number.  I told her you would call to reschedule cause I did not know where to put her.

## 2019-11-04 NOTE — Telephone Encounter (Signed)
Can you explain? The 18 month WCC I didn't understand as they look to be 23years old, is this the child of Ms. Ventresca?

## 2019-11-05 NOTE — Telephone Encounter (Signed)
What about 12/16 at 0800  ?

## 2019-11-05 NOTE — Telephone Encounter (Signed)
LVM to call back and schedule

## 2020-01-15 ENCOUNTER — Ambulatory Visit: Payer: PRIVATE HEALTH INSURANCE

## 2020-02-06 ENCOUNTER — Ambulatory Visit: Admit: 2020-02-06 | Payer: PRIVATE HEALTH INSURANCE

## 2020-02-06 DIAGNOSIS — Z0000 Encounter for general adult medical examination without abnormal findings: Secondary | ICD-10-CM

## 2020-02-06 NOTE — Patient Instructions (Signed)
Nice to meet you!    Will touch base soon after we get your labs!

## 2020-02-06 NOTE — Unmapped (Signed)
[facility]  [facility]  [address]  [place] [address]    Name:  Heidi Casey      Date of Birth: Apr 23, 1996   MRN: [id number]    Date of Service:  02/06/2020       Subjective:     Chief Complaint   Patient presents with   ??? Annual Exam     phys/est care        Heidi Casey is a 24 y.o. female here to establish care.    Acute Concerns  - no acute concerns    Chronic Issues  #Nephrolithiasis- needed lithotripsy x2 last 2019.    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 L hip has improved but intermittently has R hip pain. Continues to follow w Surgeon because of R hip with dysplasia    Depression screening: performed, negative  Alcohol misuse screening: performed, negative    Health Care Maintenance  Health Maintenance Summary                Comprehensive Physical Exam Overdue Dec 15, 1995     Immunization: HPV Overdue 02/23/2007     Immunization: DTaP/Tdap/Td Overdue 02/23/2015     Immunization: Influenza Overdue 07/30/2019     Gonorrhea Screening Next Due 11/12/2020      Performed Elsewhere 11/13/2019     Chlamydia Screening Next Due 11/12/2020      Performed Elsewhere 11/13/2019     Depression Screening Next Due 02/05/2021      Done 02/06/2020 Smart[employer]: DEPRESSION SCREENING    Cervical Cancer Screening/Pap Smear (MyChart) Next Due 11/12/2022      Performed Elsewhere 11/13/2019 normal at [facility]    Hepatitis C Screening (MyChart) This plan is no longer active.      Done 11/13/2019     HIV Screening This plan is no longer active.      Performed Elsewhere 11/13/2019     Immunization: Meningococcal ACWY This plan is no longer active.     Immunization: Pneumococcal This plan is no longer active.           History  Past Medical History:   Diagnosis Date   ??? Dysplasia of hip    ??? Kidney stone    ??? Nephrolithiasis        Past Surgical History:   Procedure Laterality Date   ??? CYSTOSCOPY W/ URETEROSCOPY W/ LITHOTRIPSY Left 12/01/2017    Procedure: CYSTOSCOPY W/ URETEROSCOPY W/  LITHOTRIPSY RETROGRADE PYLEOGRAM STENT PLACMENT;  Surgeon: Gillis Ends;  Location: [facility] OR;  Service: Urology;  Laterality: Left;   ??? EXTRACORPOREAL SHOCK WAVE LITHOTRIPSY Right 06/23/2017    Procedure: EXTRACORPOREAL SHOCK WAVE LITHORTRIPSY;  Surgeon: Gillis Ends;  Location: [facility] OR;  Service: Urology;  Laterality: Right;   ??? HIP SURGERY Left 2015   ??? HIP SURGERY Left        Family History   Problem Relation Age of Onset   ??? Cancer Father         prostate   ??? Kidney disease Father    ??? Cancer Mother         breast, colon       Social History     Socioeconomic History   ??? Marital status: Single     Spouse name: None   ??? Number of children: None   ??? Years of education: None   ??? Highest education level: None   Occupational History   ??? None   Social Needs   ???  Financial resource strain: None   ??? Food insecurity     Worry: None     Inability: None   ??? Transportation needs     Medical: None     Non-medical: None   Tobacco Use   ??? Smoking status: Never Smoker   ??? Smokeless tobacco: Never Used   Substance and Sexual Activity   ??? Alcohol use: No   ??? Drug use: No   ??? Sexual activity: Yes     Partners: Male   Lifestyle   ??? Physical activity     Days per week: None     Minutes per session: None   ??? Stress: None   Relationships   ??? Social Wellsite geologist on phone: None     Gets together: None     Attends religious service: None     Active member of club or organization: None     Attends meetings of clubs or organizations: None     Relationship status: None   ??? Intimate partner violence     Fear of current or ex partner: None     Emotionally abused: None     Physically abused: None     Forced sexual activity: None   Other Topics Concern   ??? Caffeine Use No   ??? Occupational Exposure No   ??? Exercise Yes   ??? Seat Belt Yes   Social History Narrative    Works as a Production designer, theatre/television/film for a [employer]. Mother is pediatrician at [facility]       Current Outpatient Medications   Medication Sig Dispense Refill   ???  norethindrone-e.estradiol-iron (LO LOESTRIN FE) 1 mg-10 mcg (24)/10 mcg (2) Tab Take by mouth daily.     ??? ibuprofen (ADVIL,MOTRIN) 200 MG tablet Take 200 mg by mouth every 6 hours as needed for Pain.       No current facility-administered medications for this visit.        Review of Systems    Objective:     Vitals:    02/06/20 0830   BP: 128/77   Pulse: 87   Temp: 97.1 ??F (36.2 ??C)   TempSrc: Temporal   Weight: 149 lb (67.6 kg)   Height: 5' 7.52 (1.715 m)       Physical Exam  Constitutional:       General: She is not in acute distress.     Appearance: She is well-developed.   HENT:      Head: Normocephalic and atraumatic.      Right Ear: External ear normal.      Left Ear: External ear normal.   Eyes:      Conjunctiva/sclera: Conjunctivae normal.   Neck:      Musculoskeletal: Normal range of motion and neck supple.   Cardiovascular:      Rate and Rhythm: Normal rate and regular rhythm.      Heart sounds: Normal heart sounds. No murmur.   Pulmonary:      Effort: Pulmonary effort is normal. No respiratory distress.      Breath sounds: Normal breath sounds.   Abdominal:      General: There is no distension.      Palpations: Abdomen is soft.      Tenderness: There is no abdominal tenderness.   Musculoskeletal: Normal range of motion.   Skin:     General: Skin is warm.      Findings: No rash.   Neurological:  Mental Status: She is alert and oriented to person, place, and time.           Assessment/Plan:     Heidi Casey is a 24 y.o. female here for to establish care. Has no acute issues and is in good health with history of nephrolithiasis and DDH w L PAO following with an orthopedist.    Heidi Casey was seen today for annual exam.    Diagnoses and all orders for this visit:    Well adult exam  -     Basic metabolic panel; Future  -     CBC; Future  -     Lipid Profile; Future  Well adult form completed regarding diet, exercise, safety, violence, sexual health and risk factors, health maintenance including breast  health, pap smear, dental, vision and hearing.   Reviewed with patient in detail during visit.       Return in about 1 year (around 02/05/2021).    Heidi Gary MD  Internal Medicine-Pediatrics

## 2020-03-04 ENCOUNTER — Ambulatory Visit: Admit: 2020-03-04 | Discharge: 2020-03-04 | Payer: PRIVATE HEALTH INSURANCE

## 2020-03-04 DIAGNOSIS — Z23 Encounter for immunization: Secondary | ICD-10-CM

## 2020-03-25 ENCOUNTER — Ambulatory Visit: Admit: 2020-03-25 | Discharge: 2020-03-25 | Payer: PRIVATE HEALTH INSURANCE

## 2020-03-25 DIAGNOSIS — Z23 Encounter for immunization: Secondary | ICD-10-CM

## 2020-11-04 ENCOUNTER — Ambulatory Visit: Payer: PRIVATE HEALTH INSURANCE | Attending: Sports Medicine

## 2020-11-19 ENCOUNTER — Ambulatory Visit: Payer: PRIVATE HEALTH INSURANCE

## 2021-01-29 NOTE — Progress Notes (Signed)
Formatting of this note is different from the original.  Physical Therapy Treatment Note 01/29/2021    Subjective response to last treatment/changes in functional status: Reports that she has not had pain in a while. She is feeling much better. Reports she has had a little bit of soreness here and there, but has not had pain in the last 2 weeks (since she was last in therapy).    PT Home Program Adherence: As recommended    Pain Reported:  Pain Today?: No    Treating Diagnosis  Impaired functional mobility, balance, gait, and endurance  Chronic left SI joint pain  Decreased range of left hip movement  Muscle weakness       Treatment Precautions: none noted by referring physician    Total Direct Contact Time: 45 minutes   Modality Time:   minutes   Total Treatment Time Spent: 45 minutes     Treatment Provided:   Therapeutic Procedure, PT Tests and Measures    Focus Areas:  Pre-functional/Preparatory minutes: 45    Interventions:  Neuromuscular: Postural control, Motor control  Musculoskeletal: Manual therapy, Strengthening functional, Stretching, Strengthening (PREs)  Equipment: Resistance band/ball, Bicycle/Tricycle  Education/Training: Patient Education     Exercise Resistance Completed   Assessment  x   Bike  Level 4 x   Side lying HVLA      TA march      TA extension      Belly Breathing      Tigger point release L SI      Hamstring stretch 90/90     Foam roll hip release      DL bridge foot on mini SB      Bent knee fall out      TA mini ball press leg extension      SL circle      Clamshell with resistance  BLTB x   Quad hip IR  BTB    RDL 10lb KB    Standing Clam      Step up  Diagonal     Sl side shuttle  1.5    REformer Half kneeling hip series      Refromer quad press and MT press  1red    Reformer kneeling splits      Reformer footwork     Reformer DL bridge   x   Reformer plank pushout on knees     Reformer 100s     Reformer hips   x   Lunge hover      Monster walks BTB x   MVP     Completed exercises are  represented by "X".    Education Provided:   Updated HEP for discharge    Patient/Family Understanding: Verbalized, Demonstrated, Progressing    Assessment  Response to treatment as follows: Desmond Lope presents with decreased pain and improved core and hip stability. Was able to perform functional activities with decreased pain and improved form. No increased symptoms during today's session. Her pain has decreased and no longer limits her ability to perform ADLs. Verlena demonstrates the ability to self-manage condition and is appropriate for discharge from therapy at this time.     Physical Therapy  treatment is focused on the following goals:  GOALS: Evaluation measures:   09/15/2020 Status/Progress:  01/01/21 To be met by:   Gait: The patient will safely ambulate for the duration of 15 minutes with improved push off with less than or equal to 3 deviations and/or cues in  order to participate in school and community ambulation. Left:                  Right:                  Goal MET 10/16/20    Range of Motion: The patient will demonstrate  left hip flexion to 120 deg, hip extension to 20 deg, hip abduction to 45 deg, hip adduction to 30 deg, hip IR to 45 deg and hip ER to 45 deg in order to ambulate, negotiate stairs and squat without limitations and without pain. Hip flexion: L 110 deg  Hip extension: L   deg  Hip abduction: L   deg  Hip adduction: L   deg  Hip ER (90 deg flexion): L   deg  Hip ER (0 deg flexion): L   deg  Hip IR (90 deg flexion): L   deg  Hip IR (0 deg flexion): L   deg  Hip flexion: R 150 deg  Hip extension: R   deg  Hip abduction: R   deg  Hip adduction: R   deg  Hip ER (90 deg flexion): R   deg  Hip ER (0 deg flexion): R   deg  Hip IR (90 deg flexion): R   deg  Hip IR (0 deg flexion): R   deg Hip flexion: L 150 deg, R 150 deg  Hip extension: L   deg, R   deg  Hip abduction: L   deg, R   deg  Hip adduction: L   deg, R   deg  Hip ER (90 deg flexion): L   deg, R   deg  Hip ER (0 deg  flexion): L   deg, R   deg  Hip IR (90 deg flexion): L   deg, R   deg  Hip IR (0 deg flexion): L   deg, R   deg    GOAL MET  02/13/21    Strength: The patient will demonstrate bilateral hip flexor strength of 5/5, hip extensor strength of 4+/5, hip abductor strength of 4+/5, hip adductor strength of 5/5, hip ER strength of 5/5 and hip IR 5/5(MMT) in order to perform age appropriate activities and participate in recreational play activities without limitations and without pain. Hip flexion: L 4+/5, R 5/5  Hip extension: L  /5, R  /5  Hip abduction: L 4-/5, R 5/5  Hip adduction: L 4-/5, R 5/5  Hip ER (90 deg flexion): L  /5, R  /5  Hip IR (90 deg flexion): L  /5, R  /5 Hip flexion: L 4+/5, R 5/5  Hip extension: L 4/5, R 5/5  Hip abduction: L 4-/5, R 4+/5  Hip adduction: L 4-/5, R 4+/5  Hip ER (90 deg flexion): L 4-/5, R 5/5  Hip IR (90 deg flexion): L 5/5, R 5/5  GOAL MET 01/29/21 02/13/21      Balance: The patient will demonstrate bilateral single leg balance for 60 seconds on a stable surface with eyes open with less than or equal to 1 form deviation(s) in order to ambulate safely on uneven terrain and negotiate stairs without limitations and without pain. SLS (floor): L   sec, R   sec  SLS (Airex): L   sec, R   sec SLS (floor): L 60 sec, R 60 sec  SLS (Airex): L   sec, R   sec  GOAL MET    02/13/21  Motor Function: The patient will demonstrate double leg squat, single leg squat, DL jump and SL jump with normal biomechanics greater than or equal to 90% of trials in order to perform age appropriate activities and participate in recreational play activities without pain and without deviation. DL squat: L  , R    SL squat: L  , R    DL jump: L  , R    SL jump: L  , R   DL squat: Increased hip flexion, inadequate depth  SL squat: hip drop, trunk lean, inadequate depth   GOAL MET 01/29/21 02/13/21      Patient Reported Outcomes: The patient will self-report a score of greater than or equal to a 90% on the PedsQL and greater  than or equal to a 90% on the HOS and HOS Sport. Peds QL HA: 31 Peds QL HA: 81  Oswestry: 14  GOAL DISCONTINUED 02/13/21      * Jadwiga displayed very good effort toward meeting the objectives of this session.    Continue at Frequency:  end of episode   Discharge from therapy: Patient demonstrated good pain management, goal achievement, and level of function.  Patient will continue HEP independently addressing ongoing deficits.  Pateint/caregiver to call with any questions or concerns  Duration: Current therapy episode projected through 01/29/21.    * Child Effort Rating Scale - adapted from [place] Pho., et al. "Occupational, physical, and speech therapy treatment activities during inpatient rehabilitation for traumatic brain injury." Archives of physical medicine and rehabilitation 96.8 (2015): 3236204193.    Documented by Claire Shown, PT Student, student physical therapist.  I have reviewed the history and examined the patient. I have reviewed the physical therapy student's note and agree with the findings and plan as documented.  Barbaraann Rondo, PT      Electronically signed by Barbaraann Rondo., PT at 01/29/2021 11:53 AM EST

## 2021-05-06 ENCOUNTER — Ambulatory Visit: Admit: 2021-05-06 | Discharge: 2021-05-06 | Payer: PRIVATE HEALTH INSURANCE

## 2021-05-06 DIAGNOSIS — N200 Calculus of kidney: Secondary | ICD-10-CM

## 2021-05-06 LAB — POC URINALYSIS
Bilirubin, UA: NEGATIVE
Glucose, POC, UA: NEGATIVE mg/dL
Ketones, POC, UA: NEGATIVE mg/dL
Leukocytes, POC, UA: NEGATIVE
Nitrite, POC, UA: NEGATIVE
Protein, POC, UA: NEGATIVE mg/dL
Spec Grav, UA: 1.025 (ref 1.005–1.035)
Urobilinogen, POC, UA: 0.2 mg/dL (ref 0.2–1.0)
pH, POC,UA: 7 (ref 5.0–8.0)

## 2021-05-06 NOTE — Unmapped (Signed)
I performed a history and physical exam of the patient and discussed patient management with the CNP. I reviewed the CNP note and agree with the documented findings and plan of care.

## 2021-05-06 NOTE — Unmapped (Signed)
UROLOGY H&P NOTE    Patient: Heidi Casey  Admit Date: (Not on file)  Location: Room/bed info not found    Chief Complaint: Nephrolithiais     HPI: Heidi Casey is a 25 y.o. female with a history of dysplasia of hip and kidney stones who presents for a follow-up.    Patient is a previous patient of Dr. Veto Kemps. She has previously had a ESWL (10mm stone) and URS w/ LL for the removal of kidney stones.  Her stones were found to be calcium in nature and she was given fluid and dietary advice for this.     The past 3 months - she has been having pain in her bilateral flanks (this can radiate to her abdomen)   - this pain is constant   She has sharp pain on her left groin pain - this pain lasted for about one day   - this pain is now gone   - she took an old Flomax script at that time   - she did not see a stone pass    She denies gross hematuria. No LUTS. She denies fever.     UA shows trace blood    Medical History:  Past Medical History:   Diagnosis Date   ??? Dysplasia of hip    ??? Kidney stone    ??? Nephrolithiasis        Past Surgical History:   Procedure Laterality Date   ??? CYSTOSCOPY W/ URETEROSCOPY W/ LITHOTRIPSY Left 12/01/2017    Procedure: CYSTOSCOPY W/ URETEROSCOPY W/ LITHOTRIPSY RETROGRADE PYLEOGRAM STENT PLACMENT;  Surgeon: Gillis Ends;  Location: [facility] OR;  Service: Urology;  Laterality: Left;   ??? EXTRACORPOREAL SHOCK WAVE LITHOTRIPSY Right 06/23/2017    Procedure: EXTRACORPOREAL SHOCK WAVE LITHORTRIPSY;  Surgeon: Gillis Ends;  Location: [facility] OR;  Service: Urology;  Laterality: Right;   ??? HIP SURGERY Left 2015   ??? HIP SURGERY Left        Medications:   Outpatient Meds:  Previous Medications    IBUPROFEN (ADVIL,MOTRIN) 200 MG TABLET    Take 200 mg by mouth every 6 hours as needed for Pain.    NORETHINDRONE-E.ESTRADIOL-IRON (LO LOESTRIN FE) 1 MG-10 MCG (24)/10 MCG (2) TAB    Take by mouth daily.       Inpatient Meds:  Scheduled:  Continuous:  PRN:    Allergies: No Known Allergies    SH:   Social History      Tobacco Use   ??? Smoking status: Never Smoker   ??? Smokeless tobacco: Never Used   Substance Use Topics   ??? Alcohol use: No       FH: Noncontributory.    ROS: As per HPI. Otherwise negative.    Objective:  There were no vitals filed for this visit.     Physical Examination:  Gen: No apparent distress, alert and oriented x3  HEENT: Normocephalic, EOMI, mucous membranes pink and moist  CV: RRR  Chest: No respiratory distress, good bilateral lung sounds  Abd: Soft, non-tender, non-distended   Ext: Warm and well perfused  Neuro: No focal deficits    Physical Exam  Vitals and nursing note reviewed.   Constitutional:       General: She is not in acute distress.     Appearance: Normal appearance. She is not ill-appearing.   HENT:      Head: Normocephalic.   Pulmonary:      Effort: Pulmonary effort is normal.   Musculoskeletal:  General: Normal range of motion.   Neurological:      General: No focal deficit present.      Mental Status: She is alert and oriented to person, place, and time.      Cranial Nerves: No cranial nerve deficit.      Sensory: No sensory deficit.   Psychiatric:         Mood and Affect: Mood normal.         Behavior: Behavior normal.          Labs:  No results for input(s): WBC, HGB, HCT, PLT in the last 72 hours.  No results for input(s): NA, K, CL, CO2, BUN, CREATININE, GLUCOSE, CALCIUM, MG, PHOS in the last 72 hours.  No results for input(s): INR, PROTIME in the last 72 hours.  No results found for: PSA    No results found for: COLORU, CLARITYU, PH, PROTEINUA, PHUR, LABSPEC, GLUCOSEU, BLOODU, LEUKOCYTESUR, NITRITE, BILIRUBINUR, UROBILINOGEN, RBCUA, WBCUA, BACTERIA, AMORPHOUS, CRYSTAL, CASTS    Imaging:   No results found.    Assessment:  Heidi Casey is a 25 y.o. female who presents for a follow-up of nephrolithiasis and new flank pain.    Nephrolithiasis:  - d/t new onset flank pain in the last couple of months, will get a CT KUB  - BMP  - follow-up after CT   - Litholink    Plan:  CT  KUB  BMP  Litholink    Heidi Kincer, CNP   05/06/2021

## 2021-05-07 ENCOUNTER — Inpatient Hospital Stay: Admit: 2021-05-07 | Payer: PRIVATE HEALTH INSURANCE

## 2021-05-07 ENCOUNTER — Other Ambulatory Visit: Admit: 2021-05-07 | Payer: PRIVATE HEALTH INSURANCE

## 2021-05-07 DIAGNOSIS — N200 Calculus of kidney: Secondary | ICD-10-CM

## 2021-05-07 LAB — BASIC METABOLIC PANEL
Anion Gap: 10 mmol/L (ref 3–16)
BUN: 11 mg/dL (ref 7–25)
CO2: 27 mmol/L (ref 21–33)
Calcium: 9.5 mg/dL (ref 8.6–10.3)
Chloride: 103 mmol/L (ref 98–110)
Creatinine: 0.74 mg/dL (ref 0.60–1.30)
EGFR: >90
Glucose: 79 mg/dL (ref 70–100)
Osmolality, Calculated: 288 mOsm/kg (ref 278–305)
Potassium: 4 mmol/L (ref 3.5–5.3)
Sodium: 140 mmol/L (ref 133–146)

## 2021-05-07 NOTE — Progress Notes (Signed)
Formatting of this note is different from the original.  GYN Problem    CC:  Gyn Exam (Kyleena insertion 04/01/21, doing well, no concerns, denies bleeding, cramping.)      HPI: Patient is a 25 year old year old [id number] who complains of doing well with kyleena. No menses yet        ROS:   General: denies fever / chills  HEENT: denies sore throat:  CV: denies chest pain:  Repiratory: denies shortness of breath  GI: denies abdominal pain  GU: denies dysuria:    PFSH:  I personally reviewed the past medical and surgical histories.     History   Sexual Activity   ? Sexual activity: Yes   ? Partners: Male   ? Birth control/ protection: Pill, Condom     Comment:  no hx abuse/rape      ALLERGIES / REACTIONS:  No Known Allergies            SOCIAL HISTORY:   reports that she has never smoked. She has never used smokeless tobacco. She reports current alcohol use. She reports that she does not use drugs.    PHYSICAL EXAMINATION:  Vital Signs:   Vitals:    05/07/21 0908   BP: 98/62   Weight: 154 lb (69.9 kg)   Height: 67" (170.2 cm)     Body mass index is 24.12 kg/m.     Gen WDWN NAD  Abdomen: Soft, non-tender.  Pelvic Exam:    Vulva: normal.    Urethra: normal.   Vagina: normal.    Cervix: normal. + strings   Uterus: normal size, shape and contour.    left adnexa: normal.   right adnexa: normal.   Perineum: normal.   Rectum visually normal    ASSESSMENT AND PLAN:  There are no diagnoses linked to this encounter.     Presence of IUD- doing well with Farrel Conners, MD    *Please note that some or all of this record was generated using voice recognition software. If there are any questions about the content of this document, please contact the author as some errors of transcription may have occurred.    Electronically signed by Melvyn Neth, MD at 05/07/2021  9:34 AM EDT

## 2021-05-11 NOTE — Unmapped (Signed)
Tried to reach patient over the phone to go over her CT scan results but the patient did not answer. Left a short voicemail with instructions to call the office and a call back number.    Carlean Purl, CNP

## 2021-05-24 ENCOUNTER — Ambulatory Visit: Payer: PRIVATE HEALTH INSURANCE

## 2021-06-01 NOTE — Unmapped (Signed)
Pt calling for CT scan results from 2 weeks ago.

## 2021-06-01 NOTE — Unmapped (Signed)
To this patient today regarding her CT scan that showed:  Kidneys/Ureters/Bladder: Normal size without hydronephrosis. No mass lesions. Normal appearance of the urinary bladder.     * ??Right kidney: 3 nonobstructing stones. The largest measures 5 x 4 x 6 mm in a mid pole calyx. The others are 2 mm or less in size.     * ??Left kidney: 2 nonobstructing stones in the midpole. The larger measures 6 x 4 x 4 mm and the smaller 4 x 3 x 2 mm.     The patient would like to discuss her options for stone removal surgery with Dr. Harlon Ditty.  She has had an ESWL and a laser lithotripsy done in the past.  We will schedule her for a consultation for this.    Lu Duffel, CNP

## 2021-06-02 NOTE — Unmapped (Signed)
LVM for patient to call to schedule an appointment with Dr. Harlon Ditty. Left scheduling line

## 2021-06-24 ENCOUNTER — Ambulatory Visit: Admit: 2021-06-24 | Discharge: 2021-06-24 | Payer: PRIVATE HEALTH INSURANCE

## 2021-06-24 DIAGNOSIS — N200 Calculus of kidney: Secondary | ICD-10-CM

## 2021-06-24 MED ORDER — lidocaine (PF) 2% (20 mg/mL) Soln 20 mg
20 | Freq: Once | INTRAMUSCULAR | Status: AC | PRN
Start: 2021-06-24 — End: 2021-06-24

## 2021-06-24 NOTE — Unmapped (Signed)
UROLOGY H&P NOTE    Patient: Heidi Casey  Admit Date: (Not on file)  Location: Room/bed info not found    Chief Complaint: Nephrolithiasis    HPI: Heidi Casey is a 25 y.o. female who is a recurrent stone former.  She had both ureteroscopy and laser lithotripsy and the previous ESWL with Dr. Vickii Penna.  She has a history of ESWL that she had many years ago when she was at college at [employer] as well.  She is currently complaining of left flank pain mainly on the left side.  She did not pass a kidney stone recently.  No recent urine tract infection  No frequency no urgency no burning urination.  She had a Litholink test which showed low urine volume, high oxalate urinary excretion with low citrate and high urine pH as well.  Medical History:  Past Medical History:   Diagnosis Date   ??? Dysplasia of hip    ??? Kidney stone    ??? Nephrolithiasis        Past Surgical History:   Procedure Laterality Date   ??? CYSTOSCOPY W/ URETEROSCOPY W/ LITHOTRIPSY Left 12/01/2017    Procedure: CYSTOSCOPY W/ URETEROSCOPY W/ LITHOTRIPSY RETROGRADE PYLEOGRAM STENT PLACMENT;  Surgeon: Gillis Ends;  Location: [facility] OR;  Service: Urology;  Laterality: Left;   ??? EXTRACORPOREAL SHOCK WAVE LITHOTRIPSY Right 06/23/2017    Procedure: EXTRACORPOREAL SHOCK WAVE LITHORTRIPSY;  Surgeon: Gillis Ends;  Location: [facility] OR;  Service: Urology;  Laterality: Right;   ??? HIP SURGERY Left 2015   ??? HIP SURGERY Left        Medications:   Outpatient Meds:  Previous Medications    IBUPROFEN (ADVIL,MOTRIN) 200 MG TABLET    Take 200 mg by mouth every 6 hours as needed for Pain.    NORETHINDRONE-E.ESTRADIOL-IRON (LO LOESTRIN FE) 1 MG-10 MCG (24)/10 MCG (2) TAB    Take by mouth daily.       Inpatient Meds:  Scheduled:  Continuous:  PRN:    Allergies: No Known Allergies    SH:   Social History     Tobacco Use   ??? Smoking status: Never Smoker   ??? Smokeless tobacco: Never Used   Substance Use Topics   ??? Alcohol use: No       FH: Noncontributory.    ROS: As per HPI.  Otherwise negative.    Objective:  Vitals:    06/24/21 1057   BP: 171/79   Pulse: 89   SpO2: 100%        Physical Examination:  Gen: No apparent distress, alert and oriented x3  HEENT: Normocephalic, EOMI, mucous membranes pink and moist  CV: RRR  Chest: No respiratory distress, good bilateral lung sounds  Abd: Soft, non-tender, non-distended   Ext: Warm and well perfused  Neuro: No focal deficits    Labs:  No results for input(s): WBC, HGB, HCT, PLT in the last 72 hours.  No results for input(s): NA, K, CL, CO2, BUN, CREATININE, GLUCOSE, CALCIUM, MG, PHOS in the last 72 hours.  No results for input(s): INR, PROTIME in the last 72 hours.  No results found for: PSA    Lab Results   Component Value Date    PROTEINUA Negative 05/06/2021    PHUR 7.0 05/06/2021    GLUCOSEU Negative 05/06/2021    BLOODU Trace-intact (A) 05/06/2021    LEUKOCYTESUR Negative 05/06/2021    NITRITE Negative 05/06/2021    BILIRUBINUR Negative 05/06/2021    UROBILINOGEN 0.2 05/06/2021  Imaging:   No results found.    Assessment:  Ralynn San is a 25 y.o. female is a recurrent stone former, calcium oxalate.  There was some question about possible distal RTA given her Litholink test.  First we counseled her about her treatment options for her kidney stones which included watchful waiting, ESWL and ureteroscopy and laser.  She would like to proceed with ureteroscopy and laser.  I mentioned to the patient that we will start with the left side and then proceed with the right side if we are progressing satisfactorily.  Patient understand that there might be a risk of 2 procedures with placing a stent initially.  Risks of bleeding infection and stent symptoms were described to the patient as well.  The other issue is we gave her fluid and dietary advice to help prevent kidney stones and we discussed the both adding potassium citrate and thiazide diuretics.  The patient would like to work on modifying her fluid intake and diet rather than starting  on medications.  The plan is to treat the stones and then to wait for 6 months with fluid and dietary modification and repeat the Litholink and see if her urinary parameters will improve.  Plan:  Scheduled for left ureteroscopy with laser lithotripsy plus or minus right ureteroscopy and stent placement    Zahirah Cheslock HAMDI Antoine Primas, MD   06/24/2021

## 2021-06-24 NOTE — Unmapped (Signed)
06/24/21 0006   Pre-op Phone Call   Surgery Time Verified Yes  (07/13/2021 1100)   Arrival Time Verified 0900   Surgery Location Verified Yes  [facility])   Remind patient to bring picture ID and insurance card Yes   Medical History Reviewed Yes   NPO Status Reinforced Yes  (NPO 8-6-2 hours prior to arrival time)   Ride and Caregiver Arranged Yes   Instructions to bring current medication list Yes   LVMM with Pre-Op instructions Reviewed fasting guidelines. NPO 8-6-2 hours prior to arrival time. Instructed to hold all vitamins and supplements except iron supplements from 07/06/2021 until procedure. Avoid NSAIDS including ASA, Advil, Ibuprofen, Aleve, or Naproxen from 07/06/2021 until procedure. Ok to take Acetaminophen. Reviewed med list.Pre op instructions to My Chart

## 2021-07-09 NOTE — Telephone Encounter (Signed)
Formatting of this note might be different from the original.  Spoke with patient she asked that we cancel her case that is scheduled for Tuesday, she will call back in a few weeks to reschedule   Electronically signed by Consuelo Pandy at 07/09/2021  3:18 PM EDT

## 2021-07-09 NOTE — Unmapped (Signed)
Spoke with patient she asked that we cancel her case that is scheduled for Tuesday, she will call back in a few weeks to reschedule

## 2022-10-05 ENCOUNTER — Emergency Department: Admit: 2022-10-06 | Payer: PRIVATE HEALTH INSURANCE | Primary: Orthopaedic Surgery

## 2022-10-05 DIAGNOSIS — R079 Chest pain, unspecified: Secondary | ICD-10-CM

## 2022-10-05 NOTE — Unmapped (Signed)
This is a notification of a Discharge Alert generated by [facility]. This patient visited the following location: [facility] ([facility])Admit Date: 10/05/2022 20:50Discharge Date: 10/05/2022 23:42Visit Type: EmergencyChief complaint: Chest pain,   unspecifiedDiagnosis:  (R07.9)Alert Category: Attending Physician: Bevelyn NgoMOELLMAN, JOSEPHReferring Physician: Consulting Physician: Copied Physician(s): [facility] is a not-for-profit corporation that was founded in 1997 as a community effort to enhance the   ability to share health information electronically in the [place] area. Today, [facility] is one of the nations largest and most financially sustainable regional health information exchange (HIE) organizations.

## 2022-10-05 NOTE — Unmapped (Signed)
This is a notification of an ED/Admission Alert generated by [facility]. This patient visited the following location: [facility] ([facility])Admit Date: 10/05/2022 20:50Visit Type: EmergencyChief complaint: Diagnosis:  ()Alert Category: Attending Physician:   Referring Physician: Consulting Physician: Copied Physician(s): [facility] is a not-for-profit corporation that was founded in 1997 as a community effort to enhance the ability to share health information electronically in the [place] area. Today, [facility] is one of the nations largest and most financially sustainable regional health information exchange (HIE) organizations.

## 2022-10-05 NOTE — ED Notes (Signed)
RN screened patient's swallowing by administering the [facility] Protocol. The patient consumed 3 ounces of water by cup in sequential swallows without signs/symptoms of aspiration.      Dellia Beckwith, RN  10/05/22 2146

## 2022-10-05 NOTE — Discharge Instructions (Signed)
Seen in the emergency department today for chest pain and left arm numbness.  All of your labs, EKG, chest x-ray were reassuring today.  We gave you a medicine called Pepcid and Maalox here to treat heartburn.    If you have continued symptoms, please follow-up with your primary care doctor.  To the emergency department if you have any other symptoms you feel need emergent evaluation.

## 2022-10-05 NOTE — ED Provider Notes (Cosign Needed)
THE [facility]  EMERGENCY DEPARTMENT ENCOUNTER          EM RESIDENT NOTE       Date of evaluation: 10/05/2022    Chief Complaint     Shoulder Pain and Numbness (Pt. Presents to ED via EMS with c/o left shoulder pain and numbness. States she was driving home from work when started experiencing chest pain and left arm numbness and pulled over to call EMS. )    History of Present Illness     Linda Colon is a 26 y.o. female who presents for evaluation of chest pain.  She notes that she was driving home from work around 830 and developed sudden onset pain/soreness just under her left breast which developed into some mid upper back pain.  She notes that she has had heartburn that feels similar to this.  This episode however presented with some left arm and numbness in an ulnar distribution.  This was a symptom that worried her.  During this episode she did get pretty nauseous and a little bit lightheaded.  She pulled over and called EMS to bring to the hospital.  Was concerned that she was having a heart attack.    Does know that she gets panic attacks and has had 1 that felt similar to this in the past as well.  Notes that work has been pretty stressful in the last couple days.  Otherwise, she is denying any cramping or pain in her calves, trouble breathing.  Notes that the chest pain has mostly gone down in severity but still has the upper mid back pain.    MEDICAL DECISION MAKING / ASSESSMENT / PLAN     INITIAL VITALS: BP: 120/86, Temp: 97.9 F (36.6 C), Pulse: 90, Respirations: 16, SpO2: 99 %    Linda Colon is a 26 y.o. female with no relevant past medical history presented for evaluation of chest pain.  On arrival, patient is hemodynamically stable, afebrile, satting 98% on room air.  Physical examination reveals a comfortable young woman in no acute distress.  She has clear lungs to auscultation bilaterally with an otherwise reassuring physical examination.  Suspicion for cardiac etiology or PE today.   Able to utilize [facility] rule to rule out PE in this patient.     ED Course as of 10/05/22 2346   Wed Oct 05, 2022   2253 Normal chest x-ray.  No concern for pneumothorax, focal consolidation, or any other cardiopulmonary abnormality.  Overall EKG is nonischemic.  To summarize patient's laboratory work-up thus far, her CBC is without leukocytosis or anemia.  Initial troponin is less than 6.  Patient is not pregnant.  BMP still pending at this time.  Given some Pepcid and Maalox with some minor improvement in her symptoms. [SJ]   2257 Called lab regarding BMP. Said a green tube was not added. They are adding the lab onto other tubes and will run it. [SJ]      ED Course User Index  [SJ] Thera Flake, MD     BMP resulted and was without any significant electrolyte derangements or AKI.  On reassessment, the patient feels symptomatically improved since being here and on reassurance that her chest pain is most likely not of cardiac etiology.  She was deemed stable for discharge at this time.  Husband and her mother were at bedside and will take her home.  Strict return precautions provided.  Instructed to follow-up with PCP for symptom recheck.    Is this patient  to be included in the SEP-1 core measure? No Exclusion criteria - the patient is NOT to be included for SEP-1 Core Measure due to: Infection is not suspected    Medical Decision Making  Amount and/or Complexity of Data Reviewed  Labs: ordered.  Radiology: ordered.  ECG/medicine tests: ordered.    Risk  OTC drugs.      This patient was also evaluated by the attending physician. All care plans werediscussed and agreed upon.    Clinical Impression     1. Chest pain, unspecified type        Disposition     PATIENT REFERRED TO:  Lucretia Roers, MD  [address]  [place] [facility] [address]  [phone]            DISCHARGE MEDICATIONS:  There are no discharge medications for this patient.      DISPOSITION Decision To Discharge 10/05/2022 11:21:36 PM        Diagnostic Results  and Other Data     RADIOLOGY:  XR CHEST (2 VW)   Final Result      No acute radiographic abnormality of the chest.      Electronically signed by Deloris Ping Munschauer, DO          LABS:   Results for orders placed or performed during the hospital encounter of 10/05/22   BMP w/ Reflex to MG   Result Value Ref Range    Sodium 138 136 - 145 mmol/L    Potassium reflex Magnesium 3.8 3.5 - 5.1 mmol/L    Chloride 100 99 - 110 mmol/L    CO2 20 (L) 21 - 32 mmol/L    Anion Gap 18 (H) 3 - 16    Glucose 93 70 - 99 mg/dL    BUN 9 7 - 20 mg/dL    Creatinine 0.8 0.6 - 1.1 mg/dL    Est, Glom Filt Rate >60 >60    Calcium 9.8 8.3 - 10.6 mg/dL   CBC with Auto Differential   Result Value Ref Range    WBC 6.7 4.0 - 11.0 K/uL    RBC 4.58 4.00 - 5.20 M/uL    Hemoglobin 14.2 12.0 - 16.0 g/dL    Hematocrit 82.9 56.2 - 48.0 %    MCV 88.6 80.0 - 100.0 fL    MCH 31.0 26.0 - 34.0 pg    MCHC 35.0 31.0 - 36.0 g/dL    RDW 13.0 (L) 86.5 - 15.4 %    Platelets 340 135 - 450 K/uL    MPV 7.9 5.0 - 10.5 fL    Neutrophils % 49.2 %    Lymphocytes % 39.5 %    Monocytes % 9.0 %    Eosinophils % 1.4 %    Basophils % 0.9 %    Neutrophils Absolute 3.3 1.7 - 7.7 K/uL    Lymphocytes Absolute 2.7 1.0 - 5.1 K/uL    Monocytes Absolute 0.6 0.0 - 1.3 K/uL    Eosinophils Absolute 0.1 0.0 - 0.6 K/uL    Basophils Absolute 0.1 0.0 - 0.2 K/uL   Troponin   Result Value Ref Range    Troponin, High Sensitivity <6 0 - 14 ng/L   HCG Qualitative, Serum   Result Value Ref Range    hCG Qual Negative Detects HCG level >10 MIU/mL       EKG   Interpreted in conjunction with emergencydepartment physician No att. providers found  Rhythm: normal sinus   Rate: normal  Axis: normal  Ectopy: none  Conduction: normal  ST Segments: normal  T Waves:normal  Q Waves: none  Clinical Impression: Normal sinus rhythm with sinus arrhythmia, rate of 73 bpm, PR interval 164 ms, QRS 80 ms, QTc 438 ms, no ST elevations, depressions, T wave inversions noted    ED BEDSIDE ULTRASOUND:  No results  found.    RECENT VITALS:  BP: 120/86, Temp: 97.9 F (36.6 C), Pulse: 75,Respirations: 16, SpO2: 98 %     Procedures       ED Course     Nursing Notes, Past Medical Hx, Past Surgical Hx, Social Hx, Allergies, and Family Hx were reviewed.      The patient was given the following medications:  Orders Placed This Encounter   Medications    famotidine (PEPCID) tablet 20 mg    aluminum & magnesium hydroxide-simethicone (MAALOX) 200-200-20 MG/5ML suspension 30 mL       CONSULTS:  None    Review of Systems     Reviewed and is negative if not otherwise mentioned in the HPI    Past Medical, Surgical, Family, and Social History     She has no past medical history on file.  She has no past surgical history on file.  Her family history is not on file.  She reports that she has never smoked. She has never used smokeless tobacco. She reports current alcohol use. She reports that she does not use drugs.    Medications     There are no discharge medications for this patient.    Allergies     She has No Known Allergies.    Physical Exam     INITIAL VITALS: BP: 120/86, Temp: 97.9 F (36.6 C), Pulse: 90, Respirations: 16, SpO2: 99 %   Physical Exam  Constitutional:       Appearance: Normal appearance.   HENT:      Head: Normocephalic and atraumatic.      Right Ear: External ear normal.      Left Ear: External ear normal.      Nose: Nose normal.      Mouth/Throat:      Pharynx: Oropharynx is clear.   Eyes:      Pupils: Pupils are equal, round, and reactive to light.   Cardiovascular:      Rate and Rhythm: Normal rate and regular rhythm.      Pulses: Normal pulses.   Pulmonary:      Effort: Pulmonary effort is normal.      Breath sounds: Normal breath sounds.   Abdominal:      General: Abdomen is flat.      Palpations: Abdomen is soft.      Tenderness: There is no abdominal tenderness.   Musculoskeletal:         General: Normal range of motion.      Cervical back: No tenderness.   Skin:     General: Skin is warm and dry.      Capillary  Refill: Capillary refill takes less than 2 seconds.   Neurological:      Mental Status: She is alert and oriented to person, place, and time. Mental status is at baseline.   Psychiatric:         Mood and Affect: Mood normal.         Behavior: Behavior normal.          Thera Flake, MD  Resident  10/05/22 [phone]

## 2022-10-05 NOTE — ED Provider Notes (Signed)
ED Attending Attestation Note     Date of evaluation: 10/05/2022    This patient was seen by the resident.  I have seen and examined the patient, agree with the workup, evaluation, management and diagnosis. The care plan has been discussed.  I have reviewed the ECG and concur with the resident's interpretation.  My assessment reveals alert female who presented with left-sided chest pain that radiated to her shoulder and her fourth and fifth finger were numb.  She was driving when this occurred.  No history of PE or DVT not on oral contraceptives.  Abdomen is soft no rebound or peritoneal signs PERC negative.      Merrilee Seashore, MD  10/05/22 2127

## 2022-10-06 ENCOUNTER — Inpatient Hospital Stay
Admit: 2022-10-06 | Discharge: 2022-10-06 | Disposition: A | Discharge status: Home / Self Care | Payer: PRIVATE HEALTH INSURANCE | Attending: Emergency Medicine

## 2022-10-06 LAB — BASIC METABOLIC PANEL W/ REFLEX TO MG FOR LOW K
Anion Gap: 18 — ABNORMAL HIGH (ref 3–16)
BUN: 9 mg/dL (ref 7–20)
CO2: 20 mmol/L — ABNORMAL LOW (ref 21–32)
Calcium: 9.8 mg/dL (ref 8.3–10.6)
Chloride: 100 mmol/L (ref 99–110)
Creatinine: 0.8 mg/dL (ref 0.6–1.1)
Est, Glom Filt Rate: >60 (ref >60)
Glucose: 93 mg/dL (ref 70–99)
Potassium reflex Magnesium: 3.8 mmol/L (ref 3.5–5.1)
Sodium: 138 mmol/L (ref 136–145)

## 2022-10-06 LAB — CBC WITH AUTO DIFFERENTIAL
Basophils %: 0.9 %
Basophils Absolute: 0.1 10*3/uL (ref 0.0–0.2)
Eosinophils %: 1.4 %
Eosinophils Absolute: 0.1 10*3/uL (ref 0.0–0.6)
Hematocrit: 40.6 % (ref 36.0–48.0)
Hemoglobin: 14.2 g/dL (ref 12.0–16.0)
Lymphocytes %: 39.5 %
Lymphocytes Absolute: 2.7 10*3/uL (ref 1.0–5.1)
MCH: 31 pg (ref 26.0–34.0)
MCHC: 35 g/dL (ref 31.0–36.0)
MCV: 88.6 fL (ref 80.0–100.0)
MPV: 7.9 fL (ref 5.0–10.5)
Monocytes %: 9 %
Monocytes Absolute: 0.6 10*3/uL (ref 0.0–1.3)
Neutrophils %: 49.2 %
Neutrophils Absolute: 3.3 10*3/uL (ref 1.7–7.7)
Platelets: 340 10*3/uL (ref 135–450)
RBC: 4.58 M/uL (ref 4.00–5.20)
RDW: 12.3 % — ABNORMAL LOW (ref 12.4–15.4)
WBC: 6.7 10*3/uL (ref 4.0–11.0)

## 2022-10-06 LAB — TROPONIN: Troponin, High Sensitivity: <6 ng/L (ref 0–14)

## 2022-10-06 LAB — HCG, SERUM, QUALITATIVE: hCG Qual: NEGATIVE

## 2022-10-06 MED ORDER — FAMOTIDINE 20 MG PO TABS
20 MG | Freq: Once | ORAL | Status: AC
Start: 2022-10-06 — End: 2022-10-05
  Administered 2022-10-06: 03:00:00 20 mg via ORAL

## 2022-10-06 MED ORDER — ALUM & MAG HYDROXIDE-SIMETH 200-200-20 MG/5ML PO SUSP
200-200-20 MG/5ML | Freq: Once | ORAL | Status: AC
Start: 2022-10-06 — End: 2022-10-05
  Administered 2022-10-06: 03:00:00 30 mL via ORAL

## 2022-10-06 MED FILL — FAMOTIDINE 20 MG PO TABS: 20 MG | ORAL | Qty: 1

## 2022-10-06 MED FILL — ALUM & MAG HYDROXIDE-SIMETH 1200-1200-120 MG/30ML PO SUSP: 1200-1200-120 MG/30ML | ORAL | Qty: 30

## 2022-10-08 LAB — EKG 12-LEAD
Atrial Rate: 73 {beats}/min
P Axis: 62 degrees
P-R Interval: 164 ms
Q-T Interval: 398 ms
QRS Duration: 80 ms
QTc Calculation (Bazett): 438 ms
R Axis: 54 degrees
T Axis: 50 degrees
Ventricular Rate: 73 {beats}/min

## 2023-04-13 LAB — HEMOGLOBIN A1C
Estimated Average Glucose: 88 mg/dL (ref 68–114)
Hemoglobin A1C: 4.7 % (ref 4.0–5.6)

## 2023-04-13 LAB — COMPREHENSIVE METABOLIC PANEL
A/G Ratio: 1.6 NA (ref 1.0–2.1)
ALT: 36 U/L (ref 0–40)
AST: 31 U/L — ABNORMAL HIGH (ref 0–30)
Albumin: 4.5 g/dL (ref 3.5–5.0)
Alkaline Phosphatase: 71 U/L (ref 33–140)
Anion Gap: 6 mmol/L (ref 5–13)
BUN/Creatinine Ratio: 15 NA
BUN: 12 mg/dL (ref 7–25)
CO2: 25 mmol/L (ref 22–29)
Calcium: 9.5 mg/dL (ref 8.5–10.5)
Chloride: 107 mmol/L (ref 98–110)
Creatinine: 0.79 mg/dL (ref 0.50–1.20)
EGFR: 105 See Note
Globulin, Total: 2.9 g/dL (ref 2.0–3.7)
Glucose: 88 mg/dL (ref 71–99)
Potassium: 4.4 mmol/L (ref 3.5–5.1)
Sodium: 138 mmol/L (ref 135–146)
Total Bilirubin: 0.9 mg/dL (ref 0.2–1.2)
Total Protein: 7.4 g/dL (ref 6.0–8.0)

## 2023-04-13 LAB — DIFFERENTIAL
Basophils Absolute: 0.04 10*3/uL (ref 0.00–0.20)
Basophils Relative: 0.7 %
Eosinophils Absolute: 0.07 10*3/uL (ref 0.00–0.50)
Eosinophils Relative: 1.2 %
Immature Granulocytes (%): 0.2 % (ref 0.0–2.0)
Immature Granulocytes Absolute: 0.01 10*3/uL (ref 0.00–0.10)
Lymphocytes Absolute: 1.74 10*3/uL (ref 0.80–3.90)
Lymphocytes Relative: 31 %
Monocytes Absolute: 0.46 10*3/uL (ref 0.20–0.90)
Monocytes Relative: 8.2 %
Neutrophils Absolute: 3.3 10*3/uL (ref 1.50–7.80)
Neutrophils Relative: 58.7 %
nRBC: 0 /100 WBC (ref 0–0)

## 2023-04-13 LAB — LIPID PANEL
Cholesterol, Total: 128 mg/dL (ref 125–199)
HDL: 63 mg/dL (ref 40–180)
LDL Calculated: 55 mg/dL (ref 0–100)
Non-HDL Cholesterol, Calculated: 65 mg/dL (ref 0–129)
Triglycerides: 50 mg/dL (ref 0–149)

## 2023-04-13 LAB — CBC
Hematocrit: 42.3 % (ref 35.0–46.0)
Hemoglobin: 14.4 g/dL (ref 11.7–15.5)
MCH: 30.9 pg (ref 27.0–33.0)
MCHC: 34 g/dL (ref 30.0–36.0)
MCV: 90.8 fL (ref 80.0–100.0)
MPV: 9.8 fL (ref 9.0–13.0)
Platelets: 307 10*3/uL (ref 140–400)
RBC: 4.66 10*6/uL (ref 3.80–5.10)
RDW: 11.6 % (ref 11.0–15.0)
WBC: 5.62 10*3/uL (ref 3.80–10.80)

## 2023-07-21 NOTE — Telephone Encounter (Signed)
Time held for    Spoke w/patient and wants to try Bilateral.      Will submit case 8/26 after video visit w/Dr. Vickii Penna and make changes if needed.

## 2023-07-21 NOTE — Telephone Encounter (Signed)
-----   Message from Dr. Lowella Fairy sent at 07/21/2023 11:28 AM EDT -----  Regarding: RE: obstructing stone 5 mm X 4mm  Definitely left, if she wants bilateral I am willing to try it.  ----- Message -----  From: Lamont Snowball  Sent: 07/21/2023  10:31 AM EDT  To: Gillis Ends, MD; Rhea Bleacher  Subject: RE: obstructing stone 5 mm X 4mm                 Patient scheduled for Monday for Video visit.    Would you like Bilateral or Left?    Images are being pushed.    Azaan Leask  ----- Message -----  From: Gillis Ends, MD  Sent: 07/21/2023  10:15 AM EDT  To: Rhea Bleacher; Edmund Rick  Subject: RE: obstructing stone 5 mm X 4mm                 Can we do a telehealth visit and get her in to the surgery center on the 3rd? Or sooner if I have it  ----- Message -----  From: Thomos Lemons, MD  Sent: 07/21/2023   9:50 AM EDT  To: Gillis Ends, MD; Rhea Bleacher; Sherilynn Dieu  Subject: RE: obstructing stone 5 mm X 4mm                 Please Pam  Schedule her with Dr. Vickii Penna  ----- Message -----  From: Rhea Bleacher  Sent: 07/21/2023   9:40 AM EDT  To: Gillis Ends, MD; #  Subject: obstructing stone 5 mm X 4mm                     Hi Dr. Vickii Penna and Dr. Harlon Ditty,  This patient called this morning and she has an obstructing stone ( seen @ [facility]). She seen Dr. Vickii Penna 5 years ago and she did surgery. She seen Dr. Harlon Ditty 2 years ago, because she couldn't get in with Dr. Vickii Penna. She would like to establish care again with Dr. Vickii Penna. Please advise.  Thank you,  Pam

## 2023-07-24 ENCOUNTER — Ambulatory Visit: Admit: 2023-07-24 | Discharge: 2023-07-30 | Payer: PRIVATE HEALTH INSURANCE

## 2023-07-24 ENCOUNTER — Inpatient Hospital Stay: Admit: 2023-07-24 | Payer: PRIVATE HEALTH INSURANCE

## 2023-07-24 DIAGNOSIS — N200 Calculus of kidney: Secondary | ICD-10-CM

## 2023-07-24 NOTE — Telephone Encounter (Signed)
Transitional Care Management Program     TCM Post Discharge Documentation:        07/24/2023     1:51 PM   TCM AMB Discharge Phone Contact   Discharge Date 07/20/2023   Discharge From ED   Reviewed with patient onset of symptoms Yes   Comment Was having bad back pain and chills, nausea, odd feeling of going to [facility],   Reviewed with patient what patient did to relieve symptoms  Yes   Comment called the office for oncall provider   Did you understand your discharge instructions? Yes   Reviewed with patient what changed to make them decide to go to the hospital? Yes   Comment was told to go to ER for hx of stones -   Patient able to name signs and symptoms to report to physician? Yes   New prescriptions given? Yes   Comment Given flomax, zofran, norco   Did you get your prescriptions filled? Yes   Medication reconciliation completed No   Specialist appointment needed? Yes   Comment has telehealth visit today with urology   Comment see above notes, states that today is feeling better, passed stone, no fever, had chills, has FU with [facility] urology - seen in past, has lithotripsy schedule with provider for remaining stones - encouraged pt to contact PCP with needs/concerns, aware of phys availability after hours for urgent needs

## 2023-07-24 NOTE — Telephone Encounter (Signed)
I received a chat message that there is some difficulty with today's video visit, I just tried to call twice but received no answer

## 2023-07-24 NOTE — Progress Notes (Signed)
Chief Complaint   Patient presents with    Follow-up          History of Present Illness  I was able to reach Heidi Casey via telephone from the number provided.  We were able to communicate well through our telephone conversation.     Heidi Casey returns to clinic for follow-up after ED visit. She presented with left sided flank pain and was found to have a 5mm left distal ureteral stone. She has subsequently passed the stone and is overall doing well. Her CT scan also showed a 6mm right sided stone and 7mm left sided stone. She is strongly interested in stone prevention and treatment for her kidney stones. She has had both URS and ESWL, would prefer ESWL for treatment if possible.     Prior note:   Heidi Casey returns to clinic for followup. She had no significant issues postoperatively. She reports that after she removed the stent she passed a lot of sand. She has had prior stone analysis - mixed stone, in [place], she will get [place] the results. She has no flank pain, no hematuria, no dysuria. Feeling much better overall.     Prior note:   Heidi Casey returns to clinic with a new CT scan showing left renal stones. She developed some left flank pain and nausea in November and had a CT scan performed which demonstrated a 1.2cm left pelvis stone as well as two mid pole and two lower pole stones. She has had intermittent issues with nausea and flank pain since that time, nothing severe or uncontrolled with pain medication. No dysuria, no hematuria, no UTI symptoms. She has no other complaints.     Prior note:   Urinary Calculi    Stone Location(s):  Kidney Stone  Brief History:  Heidi Casey is a pleasant 27yF with history of nephrolithiasis presenting for evaluation of same. Her father is a patient of mine with kidney stones, ureteral reimplant, and prostate cancer. She presented to a hospital in [place] in January with a 5mm distal ureteral stone. CT showed bilateral renal stones with three on left and 6 stones on  right, largest on right was 10mm. She underwent URS/LL for the distal stone. The right 10mm stone was treated with ESWL. She subsequently passed two stones and is here for further evaluation and stone treatment.     Interim History:  Since her surgery she has developed intermittent left back pain which is now radiating to her left lower quadrant. Pain can be extreme when it comes on, but resolves. No dysuria, no hematuria.     Types of Stones:  Kidney Stone  Location and Type:  Bilateral, largest was 10mm in size, but now s/p ESWL  Size (Largest/mm):  10  History:    Symptomatic distal ureteral stone, now with LLQ pain with concern for passing left stone.   Severity:    Pain Score:  Moderate pain intermittently   Initial Loc:  Left back  Moved to:  Left lower quadrant   Remains:  lower quandrant  Abated:  Occurs intermittently   Associated Symptoms:  Complains of:  none  Denies:  nausea, vomitting, fever, chills, hematuria and dysuria  Hydronephrosis present:  No  Gross Hematuria:  No  Microscopic Hematuria:  Yes  Alleviating Factors:  spontaneous abated    Radiological Findings:  non contrast CT:  report from OSH with 6 right stones, 3 left stones, 5mm distal ureteral stone   Prior Treatment:  ESWL  and Ureteroscopy w/stone extraction      Review of Systems   Constitutional:  Negative for chills and diaphoresis.   HENT:  Negative for ear discharge.    Eyes:  Negative for pain, redness and itching.   Respiratory:  Negative for cough, choking and shortness of breath.    Cardiovascular:  Negative for chest pain and leg swelling.   Gastrointestinal:  Negative for abdominal pain, anal bleeding and bloating.   Genitourinary:  Negative for frequency, nocturia and pelvic pain. Flank pain: left.  Musculoskeletal:  Negative for back pain, gait problem, joint swelling, neck pain and neck stiffness.   Skin:  Negative for pallor and rash.   Neurological:  Negative for seizures and numbness.   Psychiatric/Behavioral:  Negative  for depression and dysphoric mood.    All other systems reviewed and are negative.      Allergies  Patient has no known drug allergies or adverse reactions.    Medications  Outpatient Encounter Medications as of 07/24/2023   Medication Sig Dispense Refill    ibuprofen (ADVIL,MOTRIN) 200 MG tablet Take 200 mg by mouth every 6 hours as needed for Pain.      norethindrone-e.estradiol-iron (LO LOESTRIN FE) 1 mg-10 mcg (24)/10 mcg (2) Tab Take by mouth daily.       No facility-administered encounter medications on file as of 07/24/2023.        Histories  She has a past medical history of Dysplasia of hip, Kidney stone, and Nephrolithiasis.    She has a past surgical history that includes Hip surgery (Left, 2015); Extracorporeal shock wave lithotripsy (Right, 06/23/2017); Cystoscopy w/ ureteroscopy w/ lithotripsy (Left, 12/01/2017); and Hip surgery (Left). Left URS/LL, Right ESWL     Her family history includes prostate cancer in her father and breast cancer in mother; Kidney stones in her father.    She reports that she has never smoked. She has never used smokeless tobacco. She reports that she does not drink alcohol and does not use drugs.    The following portions of the patient's history were reviewed and updated as appropriate: allergies, current medications, past family history, past medical history, past social history, past surgical history and problem list.    There were no vitals taken for this visit.  Physical Exam   Constitutional: She is oriented to person, place, and time. She appears well-developed.   HENT:   Head: Normocephalic and atraumatic.   Eyes: Conjunctivae are normal.   Cardiovascular: Normal rate and regular rhythm.   Pulmonary/Chest: Effort normal. No respiratory distress.   Abdominal: Soft. She exhibits no distension. There is no abdominal tenderness.   Musculoskeletal:         General: Normal range of motion.      Cervical back: Normal range of motion.   Neurological: She is alert and oriented to  person, place, and time. No cranial nerve deficit.   Skin: Skin is warm and dry.   Psychiatric: Her behavior is normal. Judgment and thought content normal.            Assessment  27 y.o.F with nephrolithiasis     Plan  Stone analysis ordered  Discussed options for stone treatment. Options discussed include:      Medical expulsive therapy with pain control, increased fluid intake, and tamsulosin with straining all urine. Risk of this includes kidney damage with long term kidney blockage and failure to pass the stone. Discussed with patient that obstruction for more than 4-6 weeks could cause permanent  kidney damage and therefore intervention should be performed if no stone passage within this timeframe.      Extracorporal shockwave lithotripsy is a treatment option which is best for stones that are radioopaque and <1cm in size in the kidney or proximal ureter. Discussed risks of bleeding, infection, damage to surrounding structures. Discussed that some stones respond better to ESWL, primarily calcium oxalate monohydrate stones may not break up as easily. Discussed that all stone fragments will have to pass on their own and there may be pain associated with this.      Ureteroscopy with laser lithotripsy is another treatment option. This is best for multiple stones, stones >1cm, and mid to distal ureteral stones. This can also be done for patients on blood thinners. Discussed risks of bleeding, infection, damage to structures, risk that instruments will not pass with primary ureteroscopy in which case patient will need two weeks of passive dilation with stent and staged procedure for stone procedure.     All questions answered, patient elected to proceed with ESWL if stone analysis is favorable.     This was a phone conversation in lieu of an in-person visit. I spent 11 minutes on this call conducting an interview, performing a limited exam by phone, and educating the patient on my assessment and plan.      Medical  Decision Making  The following items were considered in medical decision making:  Review / order other diagnostic tests/interventions  Reviewed outside records

## 2023-07-25 NOTE — Telephone Encounter (Signed)
Message  Received: Today  Heidi Casey  Gillis Ends, MD  Case request canceled. And images merged.          Previous Messages       ----- Message -----  From: Gillis Ends, MD  Sent: 07/24/2023   3:33 PM EDT  To: Lamont Snowball    Hi Ilisa Hayworth, can you erase her planned surgery from [facility], she passed her stone. I ordered a stone analysis, it will take a few weeks to get that back, and when it's back she would like to have ESWL if possible for the two stones still up in the kidney. Can you get her images pushed over as well? Thank!

## 2023-08-03 NOTE — Telephone Encounter (Signed)
Case cancelled and images pushed

## 2023-08-03 NOTE — Telephone Encounter (Signed)
-----   Message from Dr. Lowella Fairy sent at 07/24/2023  3:28 PM EDT -----  Hi Heidi Casey, can you erase her planned surgery from [facility], she passed her stone. I ordered a stone analysis, it will take a few weeks to get that back, and when it's back she would like to have ESWL if possible for the two stones still up in the kidney. Can you get her images pushed over as well? Thank!

## 2024-02-20 LAB — COMPREHENSIVE METABOLIC PANEL
A/G Ratio: 1.3 NA (ref 1.0–2.1)
ALT: 39 U/L (ref 0–40)
AST: 30 U/L (ref 0–30)
Albumin: 4.4 g/dL (ref 3.5–5.0)
Alkaline Phosphatase: 82 U/L (ref 33–140)
Anion Gap: 9 mmol/L (ref 5–13)
BUN/Creatinine Ratio: 13 NA
BUN: 10 mg/dL (ref 7–25)
CO2: 24 mmol/L (ref 22–29)
Calcium: 9.4 mg/dL (ref 8.5–10.5)
Chloride: 106 mmol/L (ref 98–110)
Creatinine: 0.76 mg/dL (ref 0.50–1.20)
EGFR: 110 See Note
Globulin, Total: 3.3 g/dL (ref 2.0–3.7)
Glucose: 81 mg/dL (ref 71–99)
Potassium: 4.6 mmol/L (ref 3.5–5.1)
Sodium: 139 mmol/L (ref 135–146)
Total Bilirubin: 0.6 mg/dL (ref 0.2–1.2)
Total Protein (PE): 7.7 g/dL (ref 6.0–8.0)

## 2024-02-20 LAB — DIFFERENTIAL
Basophils Absolute: 0.04 10*3/uL (ref 0.00–0.20)
Basophils Relative: 0.8 %
Eosinophils Absolute: 0.11 10*3/uL (ref 0.00–0.50)
Eosinophils Relative: 2.2 %
Immature Granulocytes (%): 0.2 %
Immature Granulocytes Absolute: 0.01 10*3/uL (ref 0.00–0.10)
Lymphocytes Absolute: 1.38 10*3/uL (ref 0.80–3.90)
Lymphocytes Relative: 27.3 %
Monocytes Absolute: 0.44 10*3/uL (ref 0.20–0.90)
Monocytes Relative: 8.7 %
Neutrophils Absolute: 3.08 10*3/uL (ref 1.50–7.80)
Neutrophils Relative: 60.8 %
nRBC: 0 /100{WBCs} (ref 0–0)

## 2024-02-20 LAB — LIPID PANEL
Cholesterol, Total: 123 mg/dL — ABNORMAL LOW (ref 125–199)
HDL: 56 mg/dL (ref 40–180)
LDL Calculated: 56 mg/dL (ref 0–100)
Non-HDL Cholesterol, Calculated: 67 mg/dL (ref 0–129)
Triglycerides: 57 mg/dL (ref 0–149)

## 2024-02-20 LAB — CBC
Hematocrit: 44.4 % (ref 35.0–46.0)
Hemoglobin: 14.7 g/dL (ref 11.7–15.5)
MCH: 30.4 pg (ref 27.0–33.0)
MCHC: 33.1 g/dL (ref 30.0–36.0)
MCV: 91.7 fL (ref 80.0–100.0)
MPV: 9.2 fL (ref 9.0–13.0)
Platelets: 357 10*3/uL (ref 140–400)
RBC: 4.84 10*6/uL (ref 3.80–5.10)
RDW: 11.6 % (ref 11.0–15.0)
WBC: 5.06 10*3/uL (ref 4.00–12.00)

## 2024-02-20 LAB — HEMOGLOBIN A1C
Estimated Average Glucose: 91 mg/dL (ref 68–114)
Hemoglobin A1C: 4.8 % (ref 4.0–5.6)
